# Patient Record
Sex: Male | Born: 1960 | Race: White | Hispanic: No | Marital: Married | Smoking: Current every day smoker
Health system: Southern US, Community
[De-identification: ages and names within clinical notes are randomized; demographics above are authoritative.]

## PROBLEM LIST (undated history)

## (undated) DIAGNOSIS — F329 Major depressive disorder, single episode, unspecified: Secondary | ICD-10-CM

## (undated) DIAGNOSIS — E785 Hyperlipidemia, unspecified: Secondary | ICD-10-CM

## (undated) DIAGNOSIS — F32A Depression, unspecified: Secondary | ICD-10-CM

## (undated) DIAGNOSIS — T50902A Poisoning by unspecified drugs, medicaments and biological substances, intentional self-harm, initial encounter: Secondary | ICD-10-CM

## (undated) DIAGNOSIS — F1010 Alcohol abuse, uncomplicated: Secondary | ICD-10-CM

## (undated) DIAGNOSIS — F419 Anxiety disorder, unspecified: Secondary | ICD-10-CM

## (undated) DIAGNOSIS — B1920 Unspecified viral hepatitis C without hepatic coma: Secondary | ICD-10-CM

## (undated) DIAGNOSIS — F319 Bipolar disorder, unspecified: Secondary | ICD-10-CM

## (undated) DIAGNOSIS — F209 Schizophrenia, unspecified: Secondary | ICD-10-CM

---

## 2010-07-25 ENCOUNTER — Emergency Department (HOSPITAL_COMMUNITY)
Admission: EM | Admit: 2010-07-25 | Discharge: 2010-07-25 | Disposition: A | Discharge status: Home / Self Care | Payer: Self-pay | Attending: Emergency Medicine | Admitting: Emergency Medicine

## 2010-07-25 DIAGNOSIS — H0019 Chalazion unspecified eye, unspecified eyelid: Secondary | ICD-10-CM | POA: Insufficient documentation

## 2010-07-25 DIAGNOSIS — H5789 Other specified disorders of eye and adnexa: Secondary | ICD-10-CM | POA: Insufficient documentation

## 2010-10-11 ENCOUNTER — Emergency Department (HOSPITAL_COMMUNITY)
Admission: EM | Admit: 2010-10-11 | Discharge: 2010-10-11 | Disposition: A | Discharge status: Home / Self Care | Payer: Self-pay | Attending: Emergency Medicine | Admitting: Emergency Medicine

## 2010-10-11 DIAGNOSIS — Y92009 Unspecified place in unspecified non-institutional (private) residence as the place of occurrence of the external cause: Secondary | ICD-10-CM | POA: Insufficient documentation

## 2010-10-11 DIAGNOSIS — S61409A Unspecified open wound of unspecified hand, initial encounter: Secondary | ICD-10-CM | POA: Insufficient documentation

## 2010-10-11 DIAGNOSIS — IMO0002 Reserved for concepts with insufficient information to code with codable children: Secondary | ICD-10-CM | POA: Insufficient documentation

## 2010-10-11 DIAGNOSIS — IMO0001 Reserved for inherently not codable concepts without codable children: Secondary | ICD-10-CM | POA: Insufficient documentation

## 2010-11-21 ENCOUNTER — Emergency Department (HOSPITAL_COMMUNITY)
Admission: EM | Admit: 2010-11-21 | Discharge: 2010-11-22 | Disposition: A | Discharge status: Home / Self Care | Payer: Self-pay | Attending: Emergency Medicine | Admitting: Emergency Medicine

## 2010-11-21 DIAGNOSIS — F1020 Alcohol dependence, uncomplicated: Secondary | ICD-10-CM | POA: Insufficient documentation

## 2010-11-21 DIAGNOSIS — F1010 Alcohol abuse, uncomplicated: Secondary | ICD-10-CM | POA: Insufficient documentation

## 2010-11-21 LAB — ETHANOL: Alcohol, Ethyl (B): 135 mg/dL — ABNORMAL HIGH (ref 0–11)

## 2010-11-21 LAB — CBC
HCT: 45.5 % (ref 39.0–52.0)
MCHC: 35.4 g/dL (ref 30.0–36.0)
Platelets: 418 10*3/uL — ABNORMAL HIGH (ref 150–400)
RDW: 13.2 % (ref 11.5–15.5)
WBC: 8 10*3/uL (ref 4.0–10.5)

## 2010-11-21 LAB — DIFFERENTIAL
Basophils Absolute: 0.1 10*3/uL (ref 0.0–0.1)
Eosinophils Absolute: 0.2 10*3/uL (ref 0.0–0.7)
Eosinophils Relative: 3 % (ref 0–5)
Monocytes Absolute: 0.5 10*3/uL (ref 0.1–1.0)

## 2010-11-21 LAB — COMPREHENSIVE METABOLIC PANEL
AST: 21 U/L (ref 0–37)
Albumin: 4.2 g/dL (ref 3.5–5.2)
BUN: 9 mg/dL (ref 6–23)
Chloride: 105 mEq/L (ref 96–112)
Creatinine, Ser: 1.09 mg/dL (ref 0.50–1.35)
Potassium: 4.1 mEq/L (ref 3.5–5.1)
Total Bilirubin: 0.3 mg/dL (ref 0.3–1.2)
Total Protein: 8.1 g/dL (ref 6.0–8.3)

## 2011-05-21 ENCOUNTER — Emergency Department (HOSPITAL_COMMUNITY): Payer: Self-pay

## 2011-05-21 ENCOUNTER — Emergency Department (HOSPITAL_COMMUNITY)
Admission: EM | Admit: 2011-05-21 | Discharge: 2011-05-21 | Disposition: A | Discharge status: Home / Self Care | Payer: Self-pay | Attending: Emergency Medicine | Admitting: Emergency Medicine

## 2011-05-21 DIAGNOSIS — R10819 Abdominal tenderness, unspecified site: Secondary | ICD-10-CM | POA: Insufficient documentation

## 2011-05-21 DIAGNOSIS — R079 Chest pain, unspecified: Secondary | ICD-10-CM | POA: Insufficient documentation

## 2011-05-21 DIAGNOSIS — F17200 Nicotine dependence, unspecified, uncomplicated: Secondary | ICD-10-CM | POA: Insufficient documentation

## 2011-05-21 DIAGNOSIS — R109 Unspecified abdominal pain: Secondary | ICD-10-CM | POA: Insufficient documentation

## 2011-05-21 HISTORY — DX: Depression, unspecified: F32.A

## 2011-05-21 HISTORY — DX: Major depressive disorder, single episode, unspecified: F32.9

## 2011-05-21 HISTORY — DX: Unspecified viral hepatitis C without hepatic coma: B19.20

## 2011-05-21 LAB — URINALYSIS, ROUTINE W REFLEX MICROSCOPIC
Bilirubin Urine: NEGATIVE
Hgb urine dipstick: NEGATIVE
Ketones, ur: NEGATIVE mg/dL
Nitrite: NEGATIVE
Protein, ur: NEGATIVE mg/dL
Specific Gravity, Urine: 1.007 (ref 1.005–1.030)
Urobilinogen, UA: 0.2 mg/dL (ref 0.0–1.0)

## 2011-05-21 LAB — COMPREHENSIVE METABOLIC PANEL
Alkaline Phosphatase: 95 U/L (ref 39–117)
BUN: 8 mg/dL (ref 6–23)
CO2: 25 mEq/L (ref 19–32)
Chloride: 105 mEq/L (ref 96–112)
Creatinine, Ser: 0.84 mg/dL (ref 0.50–1.35)
GFR calc Af Amer: >90 mL/min (ref >90)
GFR calc non Af Amer: >90 mL/min (ref >90)
Glucose, Bld: 107 mg/dL — ABNORMAL HIGH (ref 70–99)
Potassium: 3.8 mEq/L (ref 3.5–5.1)
Total Bilirubin: 0.2 mg/dL — ABNORMAL LOW (ref 0.3–1.2)

## 2011-05-21 LAB — TROPONIN I
Troponin I: <0.3 ng/mL (ref <0.30)
Troponin I: <0.3 ng/mL (ref <0.30)

## 2011-05-21 LAB — LIPASE, BLOOD: Lipase: 87 U/L — ABNORMAL HIGH (ref 11–59)

## 2011-05-21 LAB — CBC
HCT: 42.8 % (ref 39.0–52.0)
Hemoglobin: 14.7 g/dL (ref 13.0–17.0)
MCV: 90.7 fL (ref 78.0–100.0)
WBC: 10.3 10*3/uL (ref 4.0–10.5)

## 2011-05-21 NOTE — ED Provider Notes (Signed)
History     CSN: [id number]  Arrival date & time 05/21/11  0754   First MD Initiated Contact with Patient 05/21/11 0805      Chief Complaint  Patient presents with  . Chest Pain    (Consider location/radiation/quality/duration/timing/severity/associated sxs/prior treatment) Patient is a 51 y.o. male presenting with chest pain. The history is provided by the patient. No language interpreter was used.  Chest Pain The chest pain began more  than 1 month ago (~6 months). Episode Length: An hour or two at a time. Chest pain occurs intermittently. The chest pain is unchanged. Associated with: Unknown associated factors, not related to exertion. The pain is currently at 0/10. The severity of the pain is moderate. Quality: Occasionally sharp, occasionally burning. Unable to qualify most episodes. The pain does not radiate. Primary symptoms include abdominal pain (mild, occasional). Pertinent negatives for primary symptoms include no fever, no shortness of breath, no cough and no vomiting. He tried nothing for the symptoms. Risk factors include male gender.     Past Medical History  Diagnosis Date  . Hepatitis C     History reviewed. No pertinent past surgical history.  History reviewed. No pertinent family history.  History  Substance Use Topics  . Smoking status: Current Everyday Smoker  . Smokeless tobacco: Not on file  . Alcohol Use: No     former alcohol use      Review of Systems  Constitutional: Negative for fever and chills.  Respiratory: Negative for cough and shortness of breath.   Cardiovascular: Positive for chest pain.  Gastrointestinal: Positive for abdominal pain (mild, occasional). Negative for vomiting, diarrhea and constipation.  All other systems reviewed and are negative.    Allergies  Review of patient's allergies indicates no known allergies.  Home Medications  No current outpatient prescriptions on file.  BP 118/80  Pulse 74  Temp(Src) 98 F (36.7  C) (Oral)  Resp 18  Ht 5\' 11"  (1.803 m)  Wt 180 lb (81.647 kg)  BMI 25.10 kg/m2  SpO2 100%  Physical Exam  Constitutional: He is oriented to person, place, and time. He appears well-developed and well-nourished. No distress.  HENT:  Head: Normocephalic and atraumatic.  Mouth/Throat: No oropharyngeal exudate.  Eyes: EOM are normal. Pupils are equal, round, and reactive to light.  Neck: Normal range of motion. Neck supple.  Cardiovascular: Normal rate and regular rhythm.  Exam reveals no friction rub.   No murmur heard. Pulmonary/Chest: Effort normal and breath sounds normal. No respiratory distress. He has no wheezes. He has no rales.  Abdominal: He exhibits no distension. There is tenderness (mild, epigastric, suprapubic). There is no rebound.  Musculoskeletal: Normal range of motion. He exhibits no edema.  Neurological: He is alert and oriented to person, place, and time.  Skin: He is not diaphoretic.    ED Course  Procedures (including critical care time)  Labs Reviewed - No data to display No results found.   1. Chest pain      Date: 05/21/2011  Rate: 49  Rhythm: normal sinus rhythm  QRS Axis: normal  Intervals: normal  ST/T Wave abnormalities: normal  Conduction Disutrbances:none  Narrative Interpretation:   Old EKG Reviewed: none available    MDM  78 M presents with intermittent chest pain the past 6 months.  No instigating factors to his pain. No known associated factors. No alleviating or exacerbating factors. Not having pain daily. No history of cardiac disease. Does have history of hepatitis C and depression.  Patient is afebrile and vital signs are stable. Exam is benign. We'll check delta troponin and other basic labs including liver function tests. Chest x-ray ordered. EKG nonischemic labs are normal. Patient's 3 serial troponins are negative. Stable for discharge. Patient given resources to find a primary care doctor.   Elwin Mocha, MD 05/21/11 [phone]

## 2011-05-21 NOTE — ED Notes (Signed)
Pt undressed and in gown. Cardiac monitor, pulse ox, and bp cuff on. 

## 2011-05-21 NOTE — ED Provider Notes (Signed)
  I performed a history and physical examination of Alfred Jackson and discussed his management with Dr. Gwendolyn Grant.  I agree with the history, physical, assessment, and plan of care, with the following exceptions: None  Chest pain on and off for the past 6 months. It lasts approximately an hour. The pain remains localized to left side. Occasionally burning.  Regular rate and rhythm. No reproducible chest pain. Lungs are clear to auscultation bilaterally. Abdomen soft, nontender, nondistended.  I was present for the following procedures: None Time Spent in Critical Care of the patient: None Time spent in discussions with the patient and family: 10 min  Tildon Husky, MD 05/21/11 1250

## 2011-05-21 NOTE — ED Notes (Signed)
Pt. Reports having intermittent chest pain for over 6 months with sob and nausea also intermittent. Pain is burning and non-radiating,  Pt. Also reports. "My lungs hurt for over 1 year". Pt.  Is in NAD

## 2011-07-04 ENCOUNTER — Inpatient Hospital Stay (HOSPITAL_COMMUNITY): Admission: AD | Admit: 2011-07-04 | Payer: PRIVATE HEALTH INSURANCE | Source: Ambulatory Visit | Admitting: Psychiatry

## 2011-07-04 ENCOUNTER — Emergency Department (HOSPITAL_COMMUNITY)
Admission: EM | Admit: 2011-07-04 | Discharge: 2011-07-06 | Disposition: A | Discharge status: Home / Self Care | Payer: Self-pay | Attending: Emergency Medicine | Admitting: Emergency Medicine

## 2011-07-04 ENCOUNTER — Encounter (HOSPITAL_COMMUNITY): Payer: Self-pay

## 2011-07-04 DIAGNOSIS — R5381 Other malaise: Secondary | ICD-10-CM | POA: Insufficient documentation

## 2011-07-04 DIAGNOSIS — I10 Essential (primary) hypertension: Secondary | ICD-10-CM | POA: Insufficient documentation

## 2011-07-04 DIAGNOSIS — R45851 Suicidal ideations: Secondary | ICD-10-CM

## 2011-07-04 DIAGNOSIS — F3289 Other specified depressive episodes: Secondary | ICD-10-CM | POA: Insufficient documentation

## 2011-07-04 DIAGNOSIS — T50902A Poisoning by unspecified drugs, medicaments and biological substances, intentional self-harm, initial encounter: Secondary | ICD-10-CM | POA: Insufficient documentation

## 2011-07-04 DIAGNOSIS — R404 Transient alteration of awareness: Secondary | ICD-10-CM | POA: Insufficient documentation

## 2011-07-04 DIAGNOSIS — F329 Major depressive disorder, single episode, unspecified: Secondary | ICD-10-CM | POA: Insufficient documentation

## 2011-07-04 DIAGNOSIS — T50901A Poisoning by unspecified drugs, medicaments and biological substances, accidental (unintentional), initial encounter: Secondary | ICD-10-CM | POA: Insufficient documentation

## 2011-07-04 DIAGNOSIS — Z8619 Personal history of other infectious and parasitic diseases: Secondary | ICD-10-CM | POA: Insufficient documentation

## 2011-07-04 DIAGNOSIS — R5383 Other fatigue: Secondary | ICD-10-CM | POA: Insufficient documentation

## 2011-07-04 LAB — BASIC METABOLIC PANEL
BUN: 10 mg/dL (ref 6–23)
CO2: 23 mEq/L (ref 19–32)
Calcium: 9 mg/dL (ref 8.4–10.5)
Chloride: 101 mEq/L (ref 96–112)
Creatinine, Ser: 1.02 mg/dL (ref 0.50–1.35)

## 2011-07-04 LAB — URINALYSIS, ROUTINE W REFLEX MICROSCOPIC
Bilirubin Urine: NEGATIVE
Glucose, UA: NEGATIVE mg/dL
Ketones, ur: NEGATIVE mg/dL
pH: 5.5 (ref 5.0–8.0)

## 2011-07-04 LAB — RAPID URINE DRUG SCREEN, HOSP PERFORMED
Amphetamines: NOT DETECTED
Barbiturates: NOT DETECTED
Benzodiazepines: POSITIVE — AB
Cocaine: NOT DETECTED
Tetrahydrocannabinol: POSITIVE — AB

## 2011-07-04 LAB — CBC
HCT: 44.6 % (ref 39.0–52.0)
Hemoglobin: 15.5 g/dL (ref 13.0–17.0)
MCHC: 34.8 g/dL (ref 30.0–36.0)
MCV: 88.5 fL (ref 78.0–100.0)

## 2011-07-04 LAB — DIFFERENTIAL
Basophils Relative: 0 % (ref 0–1)
Eosinophils Relative: 1 % (ref 0–5)
Monocytes Absolute: 0.7 10*3/uL (ref 0.1–1.0)
Monocytes Relative: 6 % (ref 3–12)
Neutro Abs: 8.6 10*3/uL — ABNORMAL HIGH (ref 1.7–7.7)

## 2011-07-04 MED ORDER — LORAZEPAM 1 MG PO TABS: 1.0000 mg | ORAL_TABLET | Freq: Three times a day (TID) | ORAL | Status: DC | PRN

## 2011-07-04 MED ORDER — ACETAMINOPHEN 325 MG PO TABS: 650.0000 mg | ORAL_TABLET | ORAL | Status: DC | PRN

## 2011-07-04 MED ORDER — ONDANSETRON HCL 4 MG PO TABS: 4.0000 mg | ORAL_TABLET | Freq: Three times a day (TID) | ORAL | Status: DC | PRN

## 2011-07-04 MED ORDER — SODIUM CHLORIDE 0.9 % IV BOLUS (SEPSIS)
1000.0000 mL | Freq: Once | INTRAVENOUS | Status: AC
  Administered 2011-07-04: 1000 mL via INTRAVENOUS

## 2011-07-04 MED ORDER — IBUPROFEN 600 MG PO TABS
600.0000 mg | ORAL_TABLET | Freq: Three times a day (TID) | ORAL | Status: DC | PRN
  Administered 2011-07-05: 600 mg via ORAL
  Filled 2011-07-04: qty 1

## 2011-07-04 NOTE — ED Notes (Signed)
MWU:XLKG<MW> Expected date:07/04/11<BR> Expected time: 2:29 AM<BR> Means of arrival:Ambulance<BR> Comments:<BR> Overdose, intentional

## 2011-07-04 NOTE — ED Notes (Signed)
Report to [facility], transferred to RM 38

## 2011-07-04 NOTE — ED Notes (Signed)
ACT team in room with pt 

## 2011-07-04 NOTE — ED Notes (Signed)
Diet tray given

## 2011-07-04 NOTE — ED Notes (Signed)
Pharmacy tech in talking to pt.

## 2011-07-04 NOTE — ED Notes (Signed)
Pt up to BR

## 2011-07-04 NOTE — ED Provider Notes (Signed)
History     CSN: [id number]  Arrival date & time 07/04/11  0240   First MD Initiated Contact with Patient 07/04/11 [phone]      Chief Complaint  Patient presents with  . Drug Overdose    (Consider location/radiation/quality/duration/timing/severity/associated sxs/prior treatment) HPI Comments: Patient presents after an ingestion of gabapentin, Vistaril, Zoloft. Ingestion was performed in a suicide attempt. Was found with a suicide note. His wife called EMS upon her arrival home. Found him with altered mental status. On arrival the patient is breathing comfortably and in no acute distress  Patient is a 51 y.o. male presenting with Overdose.  Drug Overdose This is a new problem. The current episode started 6 to 12 hours ago. The problem occurs constantly. The problem has been gradually worsening. Pertinent negatives include no chest pain, no abdominal pain, no headaches and no shortness of breath. The symptoms are aggravated by nothing. The symptoms are relieved by nothing. He has tried nothing for the symptoms.    Past Medical History  Diagnosis Date  . Hepatitis C   . Depression     History reviewed. No pertinent past surgical history.  History reviewed. No pertinent family history.  History  Substance Use Topics  . Smoking status: Current Everyday Smoker -- 0.5 packs/day  . Smokeless tobacco: Not on file  . Alcohol Use: No     former alcohol use      Review of Systems  Constitutional: Positive for fatigue. Negative for fever, activity change and appetite change.  HENT: Negative for congestion, sore throat, rhinorrhea, neck pain and neck stiffness.   Respiratory: Negative for cough and shortness of breath.   Cardiovascular: Negative for chest pain and palpitations.  Gastrointestinal: Positive for nausea. Negative for vomiting and abdominal pain.  Genitourinary: Negative for dysuria, urgency, frequency and flank pain.  Neurological: Negative for dizziness, weakness,  light-headedness, numbness and headaches.  Psychiatric/Behavioral: Positive for suicidal ideas and self-injury. Negative for hallucinations.  All other systems reviewed and are negative.    Allergies  Review of patient's allergies indicates no known allergies.  Home Medications   Current Outpatient Rx  Name Route Sig Dispense Refill  . GABAPENTIN 300 MG PO CAPS Oral Take 300 mg by mouth 3 (three) times daily.      Marland Kitchen HYDROXYZINE PAMOATE 25 MG PO CAPS Oral Take 25 mg by mouth 3 (three) times daily as needed.    . SERTRALINE HCL 100 MG PO TABS Oral Take 150 mg by mouth daily.        BP 114/74  Pulse 75  Temp(Src) 98.7 F (37.1 C) (Oral)  Resp 15  SpO2 96%  Physical Exam  Nursing note and vitals reviewed. Constitutional: He is oriented to person, place, and time. He appears well-developed and well-nourished. No distress.       Drowsy on examination  HENT:  Head: Normocephalic and atraumatic.  Mouth/Throat: Oropharynx is clear and moist.  Eyes: Conjunctivae and EOM are normal. Pupils are equal, round, and reactive to light.  Neck: Normal range of motion. Neck supple.  Cardiovascular: Normal rate, regular rhythm, normal heart sounds and intact distal pulses.  Exam reveals no gallop and no friction rub.   No murmur heard. Pulmonary/Chest: Effort normal and breath sounds normal.  Abdominal: Soft. Bowel sounds are normal. There is no tenderness. There is no rebound and no guarding.  Musculoskeletal: Normal range of motion. He exhibits no tenderness.  Neurological: He is alert and oriented to person, place, and time. No cranial  nerve deficit.       Slight slurring of speech from ingestion  Skin: Skin is warm and dry. No rash noted.  Psychiatric: His speech is delayed and slurred. He exhibits a depressed mood. He expresses suicidal ideation. He expresses no homicidal ideation. He expresses suicidal plans. He expresses no homicidal plans.    ED Course  Procedures (including critical  care time)   Date: 07/04/2011  Rate: 68  Rhythm: normal sinus rhythm  QRS Axis: normal  Intervals: normal  ST/T Wave abnormalities: normal  Conduction Disutrbances:none  Narrative Interpretation:   Old EKG Reviewed: unchanged  Labs Reviewed  CBC - Abnormal; Notable for the following:    WBC 12.0 (*)    Platelets 417 (*)    All other components within normal limits  DIFFERENTIAL - Abnormal; Notable for the following:    Neutro Abs 8.6 (*)    All other components within normal limits  BASIC METABOLIC PANEL - Abnormal; Notable for the following:    Sodium 134 (*)    Glucose, Bld 113 (*)    GFR calc non Af Amer 85 (*)    All other components within normal limits  SALICYLATE LEVEL - Abnormal; Notable for the following:    Salicylate Lvl <2.0 (*)    All other components within normal limits  ETHANOL  ACETAMINOPHEN LEVEL  URINALYSIS, ROUTINE W REFLEX MICROSCOPIC  URINE RAPID DRUG SCREEN (HOSP PERFORMED)   No results found.   1. Overdose drug   2. Depression   3. Suicidal ideations       MDM  Substances in which he ingested or not life-threatening and the amounts he ingested. He is out of the charcoal window. Medical screening labs were obtained and are relatively unremarkable. This time he is medically stable. He'll be evaluated by psychiatry. Psychiatric holding orders were performed. IVC paperwork was completed. Patient is a threat to himself. Will require admission for psychiatric treatment         Dayton Bailiff, MD 07/04/11 [phone]

## 2011-07-04 NOTE — BH Assessment (Signed)
Assessment Note   Alfred Jackson is an 51 y.o. male who was brought to [facility] ED by EMS after reportedly attempting suicide by overdoes on Zoloft, Gabapenitine, and Visteril. Pt was reportedly discovered, by his girlfriend, with a suicide note. Patient neither confirmed nor denied SI. Pt states he was in an argument with his girlfriend earlier tonight. Pt also endorses auditory and visual hallucinations, stating he sees and hears people. He states hallucinations have been going on "a while." Pt reports history of depression, anxiety, and alcohol abuse. Patient reports no current psychiatrist or therapist. Pt denies any prior suicide attempt. Pt denies any HI.   Pt appeared drowsy and spoke with slurred speech. Pt is a poor historian and was vague in descriptions of details of precipitating event. When asked about SI, pt looked at this Clinical research associate and smiled. Pt was then asked if he knew how much medication he took, pt continued to smile and nod, but would not give details. When asked if overdoes was intentional, pt continued to simile but would not answer. This Clinical research associate attempted to gain collateral from girlfriend, Alfred Jackson, but girlfriend's phone has been disconnected. Pt will not give additional phone numbers at this time.      Axis I: Major Depression, single episode Axis II: Deferred Axis III:  Past Medical History  Diagnosis Date  . Hepatitis C   . Depression    Axis IV: other psychosocial or environmental problems Axis V: 11-20 some danger of hurting self or others possible OR occasionally fails to maintain minimal personal hygiene OR gross impairment in communication  Past Medical History:  Past Medical History  Diagnosis Date  . Hepatitis C   . Depression     History reviewed. No pertinent past surgical history.  Family History: History reviewed. No pertinent family history.  Social History:  reports that he has been smoking.  He does not have any smokeless tobacco history on file. He  reports that he does not drink alcohol or use illicit drugs.  Additional Social History:  Alcohol / Drug Use History of alcohol / drug use?: Yes Substance #1 Name of Substance 1: Alcohol 1 - Last Use / Amount: 8/12 Allergies: No Known Allergies  Home Medications:  Medications Prior to Admission  Medication Dose Route Frequency Provider Last Rate Last Dose  . acetaminophen (TYLENOL) tablet 650 mg  650 mg Oral Q4H PRN Dayton Bailiff, MD      . ibuprofen (ADVIL,MOTRIN) tablet 600 mg  600 mg Oral Q8H PRN Dayton Bailiff, MD      . LORazepam (ATIVAN) tablet 1 mg  1 mg Oral Q8H PRN Dayton Bailiff, MD      . ondansetron [facility]) tablet 4 mg  4 mg Oral Q8H PRN Dayton Bailiff, MD      . sodium chloride 0.9 % bolus 1,000 mL  1,000 mL Intravenous Once Dayton Bailiff, MD   1,000 mL at 07/04/11 0334   Medications Prior to Admission  Medication Sig Dispense Refill  . gabapentin (NEURONTIN) 300 MG capsule Take 300 mg by mouth 3 (three) times daily.        . sertraline (ZOLOFT) 100 MG tablet Take 150 mg by mouth daily.          OB/GYN Status:  No LMP for male patient.  General Assessment Data Location of Assessment: [facility] ED ACT Assessment: Yes Living Arrangements: Spouse/significant other Can pt return to current living arrangement?: Yes Admission Status: Involuntary Is patient capable of signing voluntary admission?: Yes Transfer from:  Acute Hospital Referral Source: MD  Education Status Is patient currently in school?: No  Risk to self Suicidal Ideation: Yes-Currently Present Suicidal Intent: Yes-Currently Present Is patient at risk for suicide?: Yes Suicidal Plan?: Yes-Currently Present Specify Current Suicidal Plan: attempted suicide by overdoes Access to Means: Yes Specify Access to Suicidal Means: medication What has been your use of drugs/alcohol within the last 12 months?: alcohol Previous Attempts/Gestures: No How many times?: 0  Other Self Harm Risks: none Triggers for Past Attempts: None  known Intentional Self Injurious Behavior: None Family Suicide History: Unknown Recent stressful life event(s): Conflict (Comment) (fight with girlfreind) Persecutory voices/beliefs?: No Depression: Yes Depression Symptoms: Despondent Substance abuse history and/or treatment for substance abuse?: No Suicide prevention information given to non-admitted patients: Not applicable  Risk to Others Homicidal Ideation: No Thoughts of Harm to Others: No Current Homicidal Intent: No Current Homicidal Plan: No Access to Homicidal Means: No Identified Victim: none History of harm to others?: No Assessment of Violence: None Noted Violent Behavior Description: none Does patient have access to weapons?: No Criminal Charges Pending?: No Does patient have a court date: No  Psychosis Hallucinations: Auditory Delusions: None noted  Mental Status Report Appear/Hygiene: Bizarre Eye Contact: Fair Motor Activity: Unremarkable Speech: Slurred Level of Consciousness: Drowsy Mood: Depressed Affect: Depressed Anxiety Level: None Thought Processes: Relevant Judgement: Impaired Orientation: Person;Place Obsessive Compulsive Thoughts/Behaviors: None  Cognitive Functioning Concentration: Normal Memory: Remote Intact;Recent Intact IQ: Average Insight: Poor Impulse Control: Poor Appetite: Fair Weight Loss: 0  Weight Gain: 0  Sleep: Decreased Vegetative Symptoms: None  Prior Inpatient Therapy Prior Inpatient Therapy: No  Prior Outpatient Therapy Prior Outpatient Therapy: No  ADL Screening (condition at time of admission) Patient's cognitive ability adequate to safely complete daily activities?: Yes Patient able to express need for assistance with ADLs?: Yes Independently performs ADLs?: Yes Weakness of Legs: None Weakness of Arms/Hands: None  Home Assistive Devices/Equipment Home Assistive Devices/Equipment: None    Abuse/Neglect Assessment (Assessment to be complete while  patient is alone) Physical Abuse: Denies Verbal Abuse: Denies Sexual Abuse: Denies Exploitation of patient/patient's resources: Denies Self-Neglect: Denies Values / Beliefs Cultural Requests During Hospitalization: None   Advance Directives (For Healthcare) Advance Directive: Patient does not have advance directive Nutrition Screen Diet: Regular Unintentional weight loss greater than 10lbs within the last month: No Dysphagia: No Home Tube Feeding or Total Parenteral Nutrition (TPN): No Patient appears severely malnourished: No Pregnant or Lactating: No  Additional Information 1:1 In Past 12 Months?: No CIRT Risk: No Elopement Risk: No Does patient have medical clearance?: Yes     Disposition:  Disposition Disposition of Patient: Inpatient treatment program Type of inpatient treatment program: Adult Patient has been referred to [facility] for adult inpatient psychiatric treatment. On Site Evaluation by:   Reviewed with Physician:     Marjean Donna 07/04/2011 5:34 AM

## 2011-07-04 NOTE — ED Notes (Signed)
MD at bedside. 

## 2011-07-04 NOTE — ED Notes (Signed)
"  Alfred Jackson"- pt's family called x2 requesting information regarding pt she was advised that pt was here, resting, and that in the am at 9 he could make a phone call and call her if he wishes to do so but at this time I could not give her any further information on pt. pt has not signed consent to give out any information.

## 2011-07-04 NOTE — ED Notes (Signed)
Pt. Was given a urinal 

## 2011-07-04 NOTE — ED Notes (Signed)
Pt intentionally took gabapentin and visteril and left a suicidal note, ems was called by wife when she got home from work. They quit drinking together 6 moths ago and hes been unstable since then

## 2011-07-04 NOTE — ED Notes (Signed)
IVC paperwork dropped of by sherriff  Placed in chart

## 2011-07-05 NOTE — ED Notes (Signed)
Pt accepted to [facility] to [place]. Bed 400-2. Pt is IVC. Confirmed in compliance, however [employer] filled out Findings & Custody incorrectly (will have officers to pick up complete correctly). Updated RN & EDP. Completed documentation and forms for transport.

## 2011-07-05 NOTE — ED Notes (Signed)
Pt alert and oriented x4. Respirations even and unlabored, bilateral symmetrical rise and fall of chest. Skin warm and dry. In no acute distress. Denies needs.   

## 2011-07-05 NOTE — ED Notes (Signed)
Pt alert and oriented x4. Respirations even and unlabored, bilateral symmetrical rise and fall of chest. Skin warm and dry. In no acute distress. Reports headache 6/10. Will look at [facility]. At present pt denies SI/HI/ or auditory or visual hallucinations.

## 2011-07-05 NOTE — ED Notes (Signed)
Report given to Woodroe Mode at [facility]

## 2011-07-05 NOTE — ED Notes (Signed)
Pt asking when he was leaving to go home pt was made aware that he was awaiting in-pt bed for treatment, pt also made aware that he had several phone calls after 9pm and that at 9am he could make phone calls to let his family/friends know what was going on with him.

## 2011-07-05 NOTE — ED Notes (Signed)
[facility] called back and reported that they are not going to be able to take the pt today.

## 2011-07-06 NOTE — ED Notes (Signed)
Pt d/c home per MD order. One bag of pt belongings returned to pt. D/c instructions given.

## 2011-07-06 NOTE — ED Provider Notes (Cosign Needed)
24:41 AM 52 year old man attempted suicide by ingesiotion of gabapentin, Vistaril, and Zoloft.  Involuntarily committed.  Awaiting a bed at [facility]. Osvaldo Human, M.D.   1:19 PM Spoke to pt.  He takes Zoloft for depression, prescribed by [employer].  He took an overdose of Zoloft, gabapentin and Vistaril, and left a suicide note 2 days ago.  He has been taken off medicines and has been observed without problems.  He was supposed to go to [facility] but did not get a bed there.  He feels better and wants to go home.  I advised him that we would have him have telepsych consultation to see if he can be released home. Osvaldo Human, M.D.   4:06 PM Pt had telepsych consult with Dr. Henderson Cloud, who feels that pt can be released from his involuntary commitment.  He should followup at [facility].  No meds were prescribed. Osvaldo Human, M.D.   Carleene Cooper III, MD 07/06/11 [phone]

## 2011-07-06 NOTE — [facility] Counselor (Deleted)
Re-assessed pt to determine his level of depression. Pt stated that he "feel fine" and "I'm ready to go home, that was stupid of me to do". When asked if he had ever done anything like that before with pills, he said, "yeah, about 6-8 months ago, but I ain't never gonna do nothing like that again". Pt believes that he can safely return home and denied having any S/I, H/I, AH, or depression at this time. Pt said that if he could not go home today, it would be "ok". Pt presents with some depression and anxiety and would benefit from continued care.
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
## 2011-07-06 NOTE — ED Notes (Signed)
Per ACT rep telepsych is ordered by the MD to see if pt can safely d/c home.

## 2011-07-06 NOTE — Discharge Instructions (Signed)
Alfred Jackson, YOU WERE HOSPITALIZED IN THE [facility] FOR TWO DAYS AFTER YOU TOOK A DRUG OVERDOSE.  NOW YOU ARE FEELING BETTER.  YOU HAD A TELEPSYCH CONSULT WITH DR. Henderson Cloud, WHO RELEASED YOU FROM INVOLUNTARY COMMITMENT.  IT IS SAFE TO GO HOME.  YOU WILL NEED TO HAVE FOLLOWUP AT [facility].  CALL [facility] ON Monday TO MAKE A FOLLOWUP APPOINTMENT.      Depression You have signs of depression. This is a common problem. It can occur at any age. It is often hard to recognize. People can suffer from depression and still have moments of enjoyment. Depression interferes with your basic ability to function in life. It upsets your relationships, sleep, eating, and work habits. CAUSES  Depression is believed to be caused by an imbalance in brain chemicals. It may be triggered by an unpleasant event. Relationship crises, a death in the family, financial worries, retirement, or other stressors are normal causes of depression. Depression may also start for no known reason. Other factors that may play a part include medical illnesses, some medicines, genetics, and alcohol or drug abuse. SYMPTOMS   Feeling unhappy or worthless.   Long-lasting (chronic) tiredness or worn-out feeling.   Self-destructive thoughts and actions.   Not being able to sleep or sleeping too much.   Eating more than usual or not eating at all.   Headaches or feeling anxious.   Trouble concentrating or making decisions.   Unexplained physical problems and substance abuse.  TREATMENT  Depression usually gets better with treatment. This can include:  Antidepressant medicines. It can take weeks before the proper dose is achieved and benefits are reached.   Talking with a therapist, clergyperson, counselor, or friend. These people can help you gain insight into your problem and regain control of your life.   Eating a good diet.   Getting regular physical exercise, such as walking  for 30 minutes every day.   Not abusing alcohol or drugs.  Treating depression often takes 6 months or longer. This length of treatment is needed to keep symptoms from returning. Call your caregiver and arrange for follow-up care as suggested. SEEK IMMEDIATE MEDICAL CARE IF:   You start to have thoughts of hurting yourself or others.   Call your local emergency services (911 in [place]   Go to your local medical emergency department.   Call the National Suicide Prevention Lifeline: 1-800-273-TALK 507 229 2369).  Document Released: 04/29/2005 Document Revised: 01/09/2011 Document Reviewed: 09/29/2009 [facility] Patient Information 2012 [place], [place].

## 2012-01-29 ENCOUNTER — Emergency Department: Payer: Self-pay | Admitting: Emergency Medicine

## 2012-02-24 ENCOUNTER — Emergency Department: Payer: Self-pay | Admitting: Unknown Physician Specialty

## 2012-03-16 ENCOUNTER — Emergency Department: Payer: Self-pay | Admitting: Emergency Medicine

## 2012-03-16 LAB — COMPREHENSIVE METABOLIC PANEL
Albumin: 3.8 g/dL (ref 3.4–5.0)
Anion Gap: 9 (ref 7–16)
BUN: 8 mg/dL (ref 7–18)
Bilirubin,Total: 0.3 mg/dL (ref 0.2–1.0)
Creatinine: 1 mg/dL (ref 0.60–1.30)
Potassium: 3.9 mmol/L (ref 3.5–5.1)
SGOT(AST): 22 U/L (ref 15–37)
SGPT (ALT): 24 U/L (ref 12–78)
Total Protein: 8.3 g/dL — ABNORMAL HIGH (ref 6.4–8.2)

## 2012-03-16 LAB — CBC
HCT: 46.8 % (ref 40.0–52.0)
MCH: 31.4 pg (ref 26.0–34.0)
MCHC: 33.4 g/dL (ref 32.0–36.0)
MCV: 94 fL (ref 80–100)
Platelet: 414 10*3/uL (ref 150–440)
RBC: 4.98 10*6/uL (ref 4.40–5.90)
RDW: 14.2 % (ref 11.5–14.5)

## 2012-03-16 LAB — TSH: Thyroid Stimulating Horm: 1.61 u[IU]/mL

## 2012-03-16 LAB — ETHANOL
Ethanol %: 0.228 % — ABNORMAL HIGH (ref 0.000–0.080)
Ethanol: 228 mg/dL

## 2012-03-16 LAB — SALICYLATE LEVEL: Salicylates, Serum: 3.8 mg/dL — ABNORMAL HIGH

## 2012-03-16 LAB — ACETAMINOPHEN LEVEL: Acetaminophen: <2 ug/mL

## 2012-03-17 LAB — DRUG SCREEN, URINE
Cannabinoid 50 Ng, Ur ~~LOC~~: POSITIVE (ref ?–50)
Cocaine Metabolite,Ur ~~LOC~~: NEGATIVE (ref ?–300)
MDMA (Ecstasy)Ur Screen: NEGATIVE (ref ?–500)
Phencyclidine (PCP) Ur S: NEGATIVE (ref ?–25)

## 2012-06-21 ENCOUNTER — Encounter (HOSPITAL_COMMUNITY): Payer: Self-pay | Admitting: Emergency Medicine

## 2012-06-21 ENCOUNTER — Emergency Department (HOSPITAL_COMMUNITY)
Admission: EM | Admit: 2012-06-21 | Discharge: 2012-06-21 | Disposition: A | Discharge status: Home / Self Care | Payer: Self-pay | Attending: Emergency Medicine | Admitting: Emergency Medicine

## 2012-06-21 DIAGNOSIS — Z8619 Personal history of other infectious and parasitic diseases: Secondary | ICD-10-CM | POA: Insufficient documentation

## 2012-06-21 DIAGNOSIS — Z8659 Personal history of other mental and behavioral disorders: Secondary | ICD-10-CM | POA: Insufficient documentation

## 2012-06-21 DIAGNOSIS — K089 Disorder of teeth and supporting structures, unspecified: Secondary | ICD-10-CM | POA: Insufficient documentation

## 2012-06-21 DIAGNOSIS — R509 Fever, unspecified: Secondary | ICD-10-CM | POA: Insufficient documentation

## 2012-06-21 DIAGNOSIS — F17200 Nicotine dependence, unspecified, uncomplicated: Secondary | ICD-10-CM | POA: Insufficient documentation

## 2012-06-21 DIAGNOSIS — K0889 Other specified disorders of teeth and supporting structures: Secondary | ICD-10-CM

## 2012-06-21 DIAGNOSIS — R110 Nausea: Secondary | ICD-10-CM | POA: Insufficient documentation

## 2012-06-21 MED ORDER — PENICILLIN V POTASSIUM 500 MG PO TABS: 500.0000 mg | ORAL_TABLET | Freq: Three times a day (TID) | ORAL | Status: DC

## 2012-06-21 MED ORDER — OXYCODONE-ACETAMINOPHEN 5-325 MG PO TABS: 2.0000 | ORAL_TABLET | Freq: Four times a day (QID) | ORAL | Status: DC | PRN

## 2012-06-21 MED ORDER — PENICILLIN V POTASSIUM 250 MG PO TABS
500.0000 mg | ORAL_TABLET | Freq: Once | ORAL | Status: AC
  Administered 2012-06-21: 500 mg via ORAL
  Filled 2012-06-21: qty 2

## 2012-06-21 MED ORDER — OXYCODONE-ACETAMINOPHEN 5-325 MG PO TABS
2.0000 | ORAL_TABLET | Freq: Once | ORAL | Status: AC
  Administered 2012-06-21: 2 via ORAL
  Filled 2012-06-21: qty 2

## 2012-06-21 MED ORDER — OXYCODONE-ACETAMINOPHEN 5-325 MG PO TABS: 2.0000 | ORAL_TABLET | ORAL | Status: DC | PRN

## 2012-06-21 NOTE — ED Provider Notes (Signed)
History    This chart was scribed for non-physician practitioner working with Gwyneth Sprout, MD by Leone Payor, ED Scribe. This patient was seen in room TR09C/TR09C and the patient's care was started at 1813.   CSN: [id number]  Arrival date & time 06/21/12  1813   First MD Initiated Contact with Patient 06/21/12 1958      Chief Complaint  Patient presents with  . Dental Pain     The history is provided by the patient. No language interpreter was used.    Alfred Jackson is a 52 y.o. male who presents to the Emergency Department complaining of new, constant, gradually worsening, gradual onset dental pain starting 3 days ago. Reports the cold air makes the pain worse. He states feeling a bump to the affected area. He has taken tylenol with mild relief. He has associated subjective fever, night sweats, nausea. He denies vomiting, diarrhea, HA.   Pt has h/o hepatitis C.  Pt is a current everyday smoker and occasional alcohol user.  Past Medical History  Diagnosis Date  . Hepatitis C   . Depression     History reviewed. No pertinent past surgical history.  No family history on file.  History  Substance Use Topics  . Smoking status: Current Every Day Smoker -- 0.50 packs/day  . Smokeless tobacco: Not on file  . Alcohol Use: Yes     Comment: former alcohol use      Review of Systems A complete 10 system review of systems was obtained and all systems are negative except as noted in the HPI and PMH.    Allergies  Review of patient's allergies indicates no known allergies.  Home Medications   Current Outpatient Rx  Name  Route  Sig  Dispense  Refill  . acetaminophen (TYLENOL) 500 MG tablet   Oral   Take 1,000 mg by mouth every 6 (six) hours as needed for pain. For pain           BP 115/82  Pulse 96  Temp(Src) 98.5 F (36.9 C) (Oral)  Resp 20  SpO2 96%  Physical Exam  Nursing note and vitals reviewed. Constitutional: He is oriented to person, place, and  time. He appears well-developed and well-nourished. No distress.  HENT:  Head: Normocephalic and atraumatic.  Poor dention. Multiple cracked and decayed teeth. Lower right jaw tenderness to percussion. Right jaw facial swelling and tenderness to palpation.   Eyes: EOM are normal.  Neck: Neck supple. No tracheal deviation present.  Cardiovascular: Normal rate.   Pulmonary/Chest: Effort normal. No respiratory distress.  Musculoskeletal: Normal range of motion.  Neurological: He is alert and oriented to person, place, and time.  Skin: Skin is warm and dry.  Psychiatric: He has a normal mood and affect. His behavior is normal.    ED Course  Procedures (including critical care time)  DIAGNOSTIC STUDIES: Oxygen Saturation is 96% on room air, adequate by my interpretation.    COORDINATION OF CARE:  8:25 PM Discussed treatment plan which includes pain medication and oral antibiotics with pt at bedside and pt agreed to plan. Advised to follow up with dentist.     Labs Reviewed - No data to display No results found.   1. Pain, dental       MDM  9:47 PM Patient afebrile with stable vitals. Patient instructed to follow up with dentist within 48 hours. Patient will be discharged with Pen VK and Percocet. No further evaluation needed at this time.  I personally performed the services described in this documentation, which was scribed in my presence. The recorded information has been reviewed and is accurate.    Emilia Beck, PA-C 06/21/12 2148

## 2012-06-21 NOTE — ED Notes (Signed)
C/o toothache/dental abscess x 2 days.

## 2012-06-21 NOTE — ED Notes (Signed)
Awaiting consult with case management

## 2012-06-22 NOTE — ED Provider Notes (Signed)
Medical screening examination/treatment/procedure(s) were performed by non-physician practitioner and as supervising physician I was immediately available for consultation/collaboration.   Maddison Kilner, MD 06/22/12 0010 

## 2012-08-03 ENCOUNTER — Encounter (HOSPITAL_COMMUNITY): Payer: Self-pay | Admitting: Nurse Practitioner

## 2012-08-03 ENCOUNTER — Emergency Department (HOSPITAL_COMMUNITY)
Admission: EM | Admit: 2012-08-03 | Discharge: 2012-08-03 | Disposition: A | Discharge status: Home / Self Care | Payer: Self-pay | Attending: Emergency Medicine | Admitting: Emergency Medicine

## 2012-08-03 DIAGNOSIS — R229 Localized swelling, mass and lump, unspecified: Secondary | ICD-10-CM | POA: Insufficient documentation

## 2012-08-03 DIAGNOSIS — B1920 Unspecified viral hepatitis C without hepatic coma: Secondary | ICD-10-CM | POA: Insufficient documentation

## 2012-08-03 DIAGNOSIS — F209 Schizophrenia, unspecified: Secondary | ICD-10-CM | POA: Insufficient documentation

## 2012-08-03 DIAGNOSIS — K089 Disorder of teeth and supporting structures, unspecified: Secondary | ICD-10-CM | POA: Insufficient documentation

## 2012-08-03 DIAGNOSIS — F411 Generalized anxiety disorder: Secondary | ICD-10-CM | POA: Insufficient documentation

## 2012-08-03 DIAGNOSIS — F3289 Other specified depressive episodes: Secondary | ICD-10-CM | POA: Insufficient documentation

## 2012-08-03 DIAGNOSIS — F329 Major depressive disorder, single episode, unspecified: Secondary | ICD-10-CM | POA: Insufficient documentation

## 2012-08-03 DIAGNOSIS — K047 Periapical abscess without sinus: Secondary | ICD-10-CM | POA: Insufficient documentation

## 2012-08-03 DIAGNOSIS — F17200 Nicotine dependence, unspecified, uncomplicated: Secondary | ICD-10-CM | POA: Insufficient documentation

## 2012-08-03 DIAGNOSIS — R599 Enlarged lymph nodes, unspecified: Secondary | ICD-10-CM | POA: Insufficient documentation

## 2012-08-03 HISTORY — DX: Schizophrenia, unspecified: F20.9

## 2012-08-03 HISTORY — DX: Anxiety disorder, unspecified: F41.9

## 2012-08-03 MED ORDER — CLINDAMYCIN PHOSPHATE 900 MG/50ML IV SOLN
900.0000 mg | Freq: Once | INTRAVENOUS | Status: AC
  Administered 2012-08-03: 900 mg via INTRAVENOUS
  Filled 2012-08-03: qty 50

## 2012-08-03 MED ORDER — OXYCODONE-ACETAMINOPHEN 5-325 MG PO TABS: 1.0000 | ORAL_TABLET | Freq: Four times a day (QID) | ORAL | Status: DC | PRN

## 2012-08-03 MED ORDER — PENICILLIN V POTASSIUM 500 MG PO TABS: 500.0000 mg | ORAL_TABLET | Freq: Three times a day (TID) | ORAL | Status: DC

## 2012-08-03 MED ORDER — BUPIVACAINE HCL (PF) 0.5 % IJ SOLN
10.0000 mL | Freq: Once | INTRAMUSCULAR | Status: DC
  Filled 2012-08-03: qty 10

## 2012-08-03 MED ORDER — LIDOCAINE HCL (PF) 1 % IJ SOLN: 5.0000 mL | Freq: Once | INTRAMUSCULAR | Status: DC

## 2012-08-03 NOTE — ED Notes (Signed)
Per ems: pt with multiple c/o pain. Reports over past 2 years he has been having liver pain, back pain, tooth pain, body aches. En route, A&Ox4, VSS.

## 2012-08-03 NOTE — ED Provider Notes (Signed)
History     CSN: [id number]  Arrival date & time 08/03/12  1730   First MD Initiated Contact with Patient 08/03/12 2029      Chief Complaint  Patient presents with  . Generalized Body Aches    (Consider location/radiation/quality/duration/timing/severity/associated sxs/prior treatment) HPI Comments: Patient was seen for a brain ninth for a dental abscess, treated with penicillin, and Percocet.  He never followed up with a dentist now presents with right lower jaw pain and swelling to his oral lack of tooth as previously.  He noticed, the swelling, increased 2, days ago.  Denies any fever, nausea, vomiting, ear pain, headache  The history is provided by the patient.    Past Medical History  Diagnosis Date  . Hepatitis C   . Depression   . Schizophrenia   . Depression   . Anxiety     History reviewed. No pertinent past surgical history.  History reviewed. No pertinent family history.  History  Substance Use Topics  . Smoking status: Current Every Day Smoker -- 0.50 packs/day  . Smokeless tobacco: Not on file  . Alcohol Use: Yes     Comment: former alcohol use      Review of Systems  HENT: Positive for facial swelling and dental problem. Negative for drooling, mouth sores and trouble swallowing.   Respiratory: Negative for shortness of breath.   Gastrointestinal: Negative for vomiting.  Skin: Negative for wound.  All other systems reviewed and are negative.    Allergies  Review of patient's allergies indicates no known allergies.  Home Medications   Current Outpatient Rx  Name  Route  Sig  Dispense  Refill  . oxyCODONE-acetaminophen (PERCOCET/ROXICET) 5-325 MG per tablet   Oral   Take 1 tablet by mouth every 6 (six) hours as needed for pain.   9 tablet   0   . penicillin v potassium (VEETID) 500 MG tablet   Oral   Take 1 tablet (500 mg total) by mouth 3 (three) times daily.   30 tablet   0     BP 126/87  Pulse 87  Temp(Src) 98.4 F (36.9 C)  (Oral)  Resp 18  Ht 5\' 11"  (1.803 m)  Wt 160 lb (72.576 kg)  BMI 22.33 kg/m2  SpO2 100%  Physical Exam  Constitutional: He is oriented to person, place, and time. He appears well-developed and well-nourished.  HENT:  Head: Normocephalic.    Eyes: Pupils are equal, round, and reactive to light.  Neck: Normal range of motion.  Cardiovascular: Normal rate.   Pulmonary/Chest: Effort normal.  Abdominal: Soft. Bowel sounds are normal.  Musculoskeletal: Normal range of motion.  Lymphadenopathy:    He has cervical adenopathy.  Neurological: He is alert and oriented to person, place, and time.  Skin: Skin is warm.    ED Course  Dental Date/Time: 08/03/2012 9:18 PM Performed by: Arman Filter Authorized by: Arman Filter Consent: Verbal consent obtained. Risks and benefits: risks, benefits and alternatives were discussed Consent given by: patient Patient understanding: patient states understanding of the procedure being performed Patient identity confirmed: verbally with patient Time out: Immediately prior to procedure a "time out" was called to verify the correct patient, procedure, equipment, support staff and site/side marked as required. Local anesthesia used: yes Anesthesia: nerve block Patient sedated: no Patient tolerance: Patient tolerated the procedure well with no immediate complications. Comments: 3cc of 1% lidiocaine and .5% Sensocaine mix 60/40    (including critical care time)  Labs Reviewed -  No data to display No results found.   1. Dental abscess       MDM          Arman Filter, NP 08/03/12 2257

## 2012-08-07 NOTE — ED Provider Notes (Signed)
History/physical exam/procedure(s) were performed by non-physician practitioner and as supervising physician I was immediately available for consultation/collaboration. I have reviewed all notes and am in agreement with care and plan.   Hilario Quarry, MD 08/07/12 1550

## 2012-09-02 ENCOUNTER — Encounter (HOSPITAL_COMMUNITY): Payer: Self-pay | Admitting: *Deleted

## 2012-09-02 ENCOUNTER — Emergency Department (HOSPITAL_COMMUNITY)
Admission: EM | Admit: 2012-09-02 | Discharge: 2012-09-03 | Disposition: A | Discharge status: Home Health | Payer: Self-pay | Attending: Emergency Medicine | Admitting: Emergency Medicine

## 2012-09-02 DIAGNOSIS — F17200 Nicotine dependence, unspecified, uncomplicated: Secondary | ICD-10-CM | POA: Insufficient documentation

## 2012-09-02 DIAGNOSIS — F10929 Alcohol use, unspecified with intoxication, unspecified: Secondary | ICD-10-CM

## 2012-09-02 DIAGNOSIS — Z8619 Personal history of other infectious and parasitic diseases: Secondary | ICD-10-CM | POA: Insufficient documentation

## 2012-09-02 DIAGNOSIS — R45851 Suicidal ideations: Secondary | ICD-10-CM

## 2012-09-02 DIAGNOSIS — F1010 Alcohol abuse, uncomplicated: Secondary | ICD-10-CM | POA: Insufficient documentation

## 2012-09-02 DIAGNOSIS — Z8659 Personal history of other mental and behavioral disorders: Secondary | ICD-10-CM | POA: Insufficient documentation

## 2012-09-02 LAB — COMPREHENSIVE METABOLIC PANEL WITH GFR
ALT: 12 U/L (ref 0–53)
AST: 16 U/L (ref 0–37)
Albumin: 4.1 g/dL (ref 3.5–5.2)
Alkaline Phosphatase: 108 U/L (ref 39–117)
BUN: 9 mg/dL (ref 6–23)
CO2: 25 meq/L (ref 19–32)
Calcium: 9.2 mg/dL (ref 8.4–10.5)
Chloride: 102 meq/L (ref 96–112)
Creatinine, Ser: 0.91 mg/dL (ref 0.50–1.35)
GFR calc Af Amer: >90 mL/min
GFR calc non Af Amer: >90 mL/min
Glucose, Bld: 92 mg/dL (ref 70–99)
Potassium: 3.7 meq/L (ref 3.5–5.1)
Sodium: 140 meq/L (ref 135–145)
Total Bilirubin: 0.2 mg/dL — ABNORMAL LOW (ref 0.3–1.2)
Total Protein: 8.2 g/dL (ref 6.0–8.3)

## 2012-09-02 LAB — CBC
Hemoglobin: 15.4 g/dL (ref 13.0–17.0)
MCV: 90.3 fL (ref 78.0–100.0)
Platelets: 406 10*3/uL — ABNORMAL HIGH (ref 150–400)
RBC: 4.76 MIL/uL (ref 4.22–5.81)
WBC: 10.2 10*3/uL (ref 4.0–10.5)

## 2012-09-02 LAB — SALICYLATE LEVEL: Salicylate Lvl: <2 mg/dL — ABNORMAL LOW (ref 2.8–20.0)

## 2012-09-02 LAB — ACETAMINOPHEN LEVEL: Acetaminophen (Tylenol), Serum: <15 ug/mL (ref 10–30)

## 2012-09-02 NOTE — ED Notes (Signed)
Charge RN made aware that the patient is agitated and yelling curse words at people passing by his room.  Advised that the patient was getting aggressive with me.  Security and EDP made aware of patient's behavior.  Security to come to bedside.

## 2012-09-02 NOTE — ED Notes (Signed)
[employer] is at the bedside.  

## 2012-09-02 NOTE — ED Notes (Signed)
Pt states he came in because his "girlfriend told him to".  Says he is feeling "looney-tooney", when asked if he is feeling suicidal or homicidal-pt states "maybe" to both.  Also states he is hearing voices who "tell me don't do this".  Also c/o pain all over.

## 2012-09-03 ENCOUNTER — Encounter (HOSPITAL_COMMUNITY): Payer: Self-pay | Admitting: Emergency Medicine

## 2012-09-03 LAB — RAPID URINE DRUG SCREEN, HOSP PERFORMED
Amphetamines: NOT DETECTED
Barbiturates: NOT DETECTED
Tetrahydrocannabinol: NOT DETECTED

## 2012-09-03 MED ORDER — LORAZEPAM 1 MG PO TABS: 0.0000 mg | ORAL_TABLET | Freq: Four times a day (QID) | ORAL | Status: DC

## 2012-09-03 MED ORDER — SODIUM CHLORIDE 0.9 % IV SOLN: INTRAVENOUS | Status: DC

## 2012-09-03 MED ORDER — IBUPROFEN 400 MG PO TABS: 600.0000 mg | ORAL_TABLET | Freq: Three times a day (TID) | ORAL | Status: DC | PRN

## 2012-09-03 MED ORDER — NICOTINE 21 MG/24HR TD PT24: 21.0000 mg | MEDICATED_PATCH | Freq: Every day | TRANSDERMAL | Status: DC

## 2012-09-03 MED ORDER — LORAZEPAM 2 MG/ML IJ SOLN: 1.0000 mg | Freq: Four times a day (QID) | INTRAMUSCULAR | Status: DC | PRN

## 2012-09-03 MED ORDER — LORAZEPAM 1 MG PO TABS: 1.0000 mg | ORAL_TABLET | Freq: Three times a day (TID) | ORAL | Status: DC | PRN

## 2012-09-03 MED ORDER — LORAZEPAM 1 MG PO TABS: 0.0000 mg | ORAL_TABLET | Freq: Two times a day (BID) | ORAL | Status: DC

## 2012-09-03 MED ORDER — ALUM & MAG HYDROXIDE-SIMETH 200-200-20 MG/5ML PO SUSP: 30.0000 mL | ORAL | Status: DC | PRN

## 2012-09-03 MED ORDER — THIAMINE HCL 100 MG/ML IJ SOLN: 100.0000 mg | Freq: Every day | INTRAMUSCULAR | Status: DC

## 2012-09-03 MED ORDER — FOLIC ACID 1 MG PO TABS: 1.0000 mg | ORAL_TABLET | Freq: Every day | ORAL | Status: DC

## 2012-09-03 MED ORDER — ONDANSETRON HCL 8 MG PO TABS: 4.0000 mg | ORAL_TABLET | Freq: Three times a day (TID) | ORAL | Status: DC | PRN

## 2012-09-03 MED ORDER — VITAMIN B-1 100 MG PO TABS: 100.0000 mg | ORAL_TABLET | Freq: Every day | ORAL | Status: DC

## 2012-09-03 MED ORDER — ADULT MULTIVITAMIN W/MINERALS CH: 1.0000 | ORAL_TABLET | Freq: Every day | ORAL | Status: DC

## 2012-09-03 MED ORDER — ZIPRASIDONE MESYLATE 20 MG IM SOLR
20.0000 mg | Freq: Once | INTRAMUSCULAR | Status: AC
  Administered 2012-09-03: 20 mg via INTRAMUSCULAR
  Filled 2012-09-03: qty 20

## 2012-09-03 MED ORDER — LORAZEPAM 1 MG PO TABS: 1.0000 mg | ORAL_TABLET | Freq: Four times a day (QID) | ORAL | Status: DC | PRN

## 2012-09-03 NOTE — ED Notes (Signed)
ACT TEAM IN TO EVAL PT

## 2012-09-03 NOTE — ED Notes (Signed)
SOCIAL WORKER HAS SEEN PT AND GIVEN HIM RESOURCES

## 2012-09-03 NOTE — ED Notes (Signed)
Dr Silverio Lay in to evaluate patient

## 2012-09-03 NOTE — ED Notes (Signed)
Pt very aggitated and assessment not able to be done.

## 2012-09-03 NOTE — ED Notes (Signed)
MD at bedside. 

## 2012-09-03 NOTE — ED Notes (Addendum)
Spoke to wife on the phone, states that pt admitted to her that he was suicidal. pt wife wants updates [phone] Jacqulynn Cadet

## 2012-09-03 NOTE — ED Provider Notes (Addendum)
Patient not intoxicated this AM. Feeling better, has some chronic back pain. No issues as per nursing. Patient denies suicidal or homicidal ideations. Doesn't want detox. Will have ACT eval.   Richardean Canal, MD 09/03/12 0719  7:48 AM ACT evaluated, given resources. Not suicidal or homicidal.   Richardean Canal, MD 09/03/12 [phone]
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
## 2012-09-03 NOTE — Progress Notes (Signed)
Clinical Social Work Department BRIEF PSYCHOSOCIAL ASSESSMENT 09/03/2012  Patient:  Alfred Jackson, Alfred Jackson     Account Number:  [id number]     Admit date:  09/02/2012  Clinical Social Worker:  Leron Croak, CLINICAL SOCIAL WORKER  Date/Time:  09/03/2012 08:36 AM  Referred by:  Physician  Date Referred:  09/03/2012 Referred for  Substance Abuse   Other Referral:   Interview type:  Patient Other interview type:    PSYCHOSOCIAL DATA Living Status:  SIGNIFICANT OTHER Admitted from facility:   Level of care:   Primary support name:  Louretta Shorten Primary support relationship to patient:  PARTNER Degree of support available:   Pt has some support    CURRENT CONCERNS Current Concerns  Substance Abuse  Financial Resources   Other Concerns:    SOCIAL WORK ASSESSMENT / PLAN CSW met with the Pt at the bedside to discuss Pt concerns with finances. Pt was expecting CSW and stated the he "needs help with finances". Pt stated he has filed for disabiltiy (SSI) and is waiting to hear back from them. Pt is unemployed and his disability claim is for depression and anxiety. Pt did not endorse a problem with his alocohol and claimed that his significant other was the one who made him come to the ED. Pt stated he did not "do anything" to cause her to send him here" and "it is her that has the problem". Pt does not want to consider treatment at this time. Pt did accept financial resource information, food pantry, and alcohol and substance abuse treatment resources.  No further needs at this time.   Assessment/plan status:  Information/Referral to [facility] Other assessment/ plan:   Information/referral to community resources:   CSW gave resources listed above.    PATIENT'S/FAMILY'S RESPONSE TO PLAN OF CARE: Pt appreciative for assistance and stated he is ready to leave.       Leron Croak, LCSWA [facility] Emergency Dept.  [phone]
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
## 2012-09-13 ENCOUNTER — Emergency Department (HOSPITAL_COMMUNITY)
Admission: EM | Admit: 2012-09-13 | Discharge: 2012-09-14 | Disposition: A | Discharge status: Home / Self Care | Payer: Self-pay | Attending: Emergency Medicine | Admitting: Emergency Medicine

## 2012-09-13 DIAGNOSIS — Y92009 Unspecified place in unspecified non-institutional (private) residence as the place of occurrence of the external cause: Secondary | ICD-10-CM | POA: Insufficient documentation

## 2012-09-13 DIAGNOSIS — Z8659 Personal history of other mental and behavioral disorders: Secondary | ICD-10-CM | POA: Insufficient documentation

## 2012-09-13 DIAGNOSIS — F17200 Nicotine dependence, unspecified, uncomplicated: Secondary | ICD-10-CM | POA: Insufficient documentation

## 2012-09-13 DIAGNOSIS — S91012A Laceration without foreign body, left ankle, initial encounter: Secondary | ICD-10-CM

## 2012-09-13 DIAGNOSIS — Y939 Activity, unspecified: Secondary | ICD-10-CM | POA: Insufficient documentation

## 2012-09-13 DIAGNOSIS — Z8619 Personal history of other infectious and parasitic diseases: Secondary | ICD-10-CM | POA: Insufficient documentation

## 2012-09-13 DIAGNOSIS — S91009A Unspecified open wound, unspecified ankle, initial encounter: Secondary | ICD-10-CM | POA: Insufficient documentation

## 2012-09-13 DIAGNOSIS — W208XXA Other cause of strike by thrown, projected or falling object, initial encounter: Secondary | ICD-10-CM | POA: Insufficient documentation

## 2012-09-13 DIAGNOSIS — S81009A Unspecified open wound, unspecified knee, initial encounter: Secondary | ICD-10-CM | POA: Insufficient documentation

## 2012-09-13 NOTE — ED Notes (Signed)
Pt cut his LT lower leg with angle grinder. Dsy stained in arrival to ED and bleeding controled. Pt ambulatory from his home to EMS truck as reported by EMS. Pt is able to move all toes and pulse present.

## 2012-09-13 NOTE — ED Notes (Signed)
Pt to xray via stretcher, bleeding controlled.

## 2012-09-14 ENCOUNTER — Emergency Department (HOSPITAL_COMMUNITY): Payer: Self-pay

## 2012-09-14 ENCOUNTER — Emergency Department (HOSPITAL_COMMUNITY)
Admission: EM | Admit: 2012-09-14 | Discharge: 2012-09-15 | Disposition: A | Discharge status: Home / Self Care | Payer: Self-pay | Attending: Emergency Medicine | Admitting: Emergency Medicine

## 2012-09-14 DIAGNOSIS — F17200 Nicotine dependence, unspecified, uncomplicated: Secondary | ICD-10-CM | POA: Insufficient documentation

## 2012-09-14 DIAGNOSIS — Z8659 Personal history of other mental and behavioral disorders: Secondary | ICD-10-CM | POA: Insufficient documentation

## 2012-09-14 DIAGNOSIS — S81009A Unspecified open wound, unspecified knee, initial encounter: Secondary | ICD-10-CM | POA: Insufficient documentation

## 2012-09-14 DIAGNOSIS — S91011D Laceration without foreign body, right ankle, subsequent encounter: Secondary | ICD-10-CM

## 2012-09-14 DIAGNOSIS — W208XXA Other cause of strike by thrown, projected or falling object, initial encounter: Secondary | ICD-10-CM | POA: Insufficient documentation

## 2012-09-14 DIAGNOSIS — Y929 Unspecified place or not applicable: Secondary | ICD-10-CM | POA: Insufficient documentation

## 2012-09-14 DIAGNOSIS — Z8619 Personal history of other infectious and parasitic diseases: Secondary | ICD-10-CM | POA: Insufficient documentation

## 2012-09-14 DIAGNOSIS — Y939 Activity, unspecified: Secondary | ICD-10-CM | POA: Insufficient documentation

## 2012-09-14 MED ORDER — CEPHALEXIN 500 MG PO CAPS: 500.0000 mg | ORAL_CAPSULE | Freq: Four times a day (QID) | ORAL | Status: DC

## 2012-09-14 MED ORDER — IBUPROFEN 400 MG PO TABS
600.0000 mg | ORAL_TABLET | Freq: Once | ORAL | Status: AC
  Administered 2012-09-14: 600 mg via ORAL

## 2012-09-14 MED ORDER — HYDROCODONE-ACETAMINOPHEN 5-325 MG PO TABS: 1.0000 | ORAL_TABLET | Freq: Once | ORAL | Status: DC

## 2012-09-14 MED ORDER — HYDROCODONE-ACETAMINOPHEN 5-325 MG PO TABS
1.0000 | ORAL_TABLET | Freq: Once | ORAL | Status: AC
  Administered 2012-09-15: 1 via ORAL
  Filled 2012-09-14 (×2): qty 1

## 2012-09-14 MED ORDER — CEPHALEXIN 250 MG PO CAPS
500.0000 mg | ORAL_CAPSULE | Freq: Once | ORAL | Status: AC
  Administered 2012-09-15: 500 mg via ORAL
  Filled 2012-09-14: qty 2

## 2012-09-14 NOTE — ED Provider Notes (Signed)
History     CSN: [id number]  Arrival date & time 09/14/12  2126   First MD Initiated Contact with Patient 09/14/12 2338      Chief Complaint  Patient presents with  . Ankle Pain    (Consider location/radiation/quality/duration/timing/severity/associated sxs/prior treatment) HPI Comments: Seen yesterday for laceration to medial L ankle now here with increased pain and erythema Has not taken any meds PTA  Patient is a 52 y.o. male presenting with ankle pain. The history is provided by the patient.  Ankle Pain Location:  Ankle Time since incident:  2 days Injury: yes   Ankle location:  R ankle Pain details:    Quality:  Aching   Radiates to:  Does not radiate   Severity:  Moderate   Onset quality:  Sudden   Timing:  Constant   Progression:  Worsening Chronicity:  New   Past Medical History  Diagnosis Date  . Hepatitis C   . Depression   . Schizophrenia   . Depression   . Anxiety     No past surgical history on file.  No family history on file.  History  Substance Use Topics  . Smoking status: Current Every Day Smoker -- 0.50 packs/day  . Smokeless tobacco: Not on file  . Alcohol Use: Yes     Comment: current ETOH level 205 on 09/02/12      Review of Systems  Musculoskeletal: Positive for joint swelling.  Skin: Positive for color change and wound.  All other systems reviewed and are negative.    Allergies  Review of patient's allergies indicates no known allergies.  Home Medications   Current Outpatient Rx  Name  Route  Sig  Dispense  Refill  . cephALEXin (KEFLEX) 500 MG capsule   Oral   Take 1 capsule (500 mg total) by mouth 4 (four) times daily.   28 capsule   0   . HYDROcodone-acetaminophen (NORCO/VICODIN) 5-325 MG per tablet   Oral   Take 1 tablet by mouth once.   18 tablet   0     BP 138/89  Pulse 77  Temp(Src) 98 F (36.7 C) (Oral)  Resp 14  SpO2 98%  Physical Exam  Nursing note and vitals reviewed. Constitutional: He is  oriented to person, place, and time. He appears well-developed and well-nourished.  HENT:  Head: Normocephalic.  Eyes: Pupils are equal, round, and reactive to light.  Neck: Normal range of motion.  Cardiovascular: Normal rate.   Musculoskeletal: He exhibits edema and tenderness.       Feet:  Laceration with surrounding erythema   Neurological: He is alert and oriented to person, place, and time.  Skin: Skin is warm.    ED Course  Procedures (including critical care time)  Labs Reviewed - No data to display Dg Ankle Complete Left  09/14/2012  *RADIOLOGY REPORT*  Clinical Data: Laceration to left ankle.  LEFT ANKLE COMPLETE - 3+ VIEW  Comparison: None.  Findings: Radiopaque densities project over the soft tissues, likely external.  No underlying bony abnormality.  No fracture, subluxation or dislocation.  Joint spaces are maintained.  IMPRESSION: No acute bony abnormality.   Original Report Authenticated By: Charlett Nose, M.D.      1. Laceration of ankle with complication, right, subsequent encounter       MDM   Suture line intact but concern for erythema  Will start antibiotics        Arman Filter, NP 09/14/12 2349

## 2012-09-14 NOTE — ED Notes (Addendum)
Pt back from xray, room door left open, fall safety huddle done with POD B staff, fall risk explained again to pt, verbalizes understanding, also pt attempting to barter requests for drink with consequence that he will get up if he does not get drink or pain med. Pt playful. CBIR. Bleeding remains controlled. CMS intact.

## 2012-09-14 NOTE — ED Notes (Signed)
Patient presents with continued pain to medial left ankle. Was evaluated yesterday for left ankle laceration after dropping an angle grinder on it. Xray's were negative. Was given Rx ibuprofen which he has not filed. Patient is requesting pain medications and a ride home.

## 2012-09-14 NOTE — ED Provider Notes (Signed)
History     CSN: [id number]  Arrival date & time 09/13/12  2309   First MD Initiated Contact with Patient 09/13/12 2319      Chief Complaint  Patient presents with  . Laceration    lt shin    (Consider location/radiation/quality/duration/timing/severity/associated sxs/prior treatment) HPI 52 year old male presents to emergency room via EMS after sustaining left ankle laceration after dropping Angle grinder on it.  Pt reports last tetanus shot 2 years ago after cat bite.  Pt c/o pain.  EMS reports pt able to ambulate to ambulance from home.  No other injuries.  Pt appears heavily intoxicated.      Past Medical History  Diagnosis Date  . Hepatitis C   . Depression   . Schizophrenia   . Depression   . Anxiety     No past surgical history on file.  No family history on file.  History  Substance Use Topics  . Smoking status: Current Every Day Smoker -- 0.50 packs/day  . Smokeless tobacco: Not on file  . Alcohol Use: Yes     Comment: current ETOH level 205 on 09/02/12      Review of Systems  Unable to perform ROS: Other   intoxication  Allergies  Review of patient's allergies indicates no known allergies.  Home Medications  No current outpatient prescriptions on file.  BP 123/77  Pulse 98  Temp(Src) 98 F (36.7 C) (Oral)  Resp 16  SpO2 98%  Physical Exam  Nursing note and vitals reviewed. Constitutional: He appears well-developed and well-nourished. No distress.  Pulmonary/Chest: Effort normal and breath sounds normal. No respiratory distress. He has no wheezes. He has no rales. He exhibits no tenderness.  Abdominal: Soft. Bowel sounds are normal. He exhibits no distension and no mass. There is no tenderness. There is no rebound and no guarding.  Musculoskeletal: Normal range of motion. He exhibits tenderness. He exhibits no edema.  4 cm laceration to medial left ankle, bleeding controlled.  The wound explored, no foreign bodies noted.  No tendon injury.   Neurological: He is alert. He displays normal reflexes. Coordination normal.  Skin: Skin is warm and dry. No rash noted. No erythema. No pallor.    ED Course  Procedures (including critical care time)  Labs Reviewed - No data to display Dg Ankle Complete Left  09/14/2012  *RADIOLOGY REPORT*  Clinical Data: Laceration to left ankle.  LEFT ANKLE COMPLETE - 3+ VIEW  Comparison: None.  Findings: Radiopaque densities project over the soft tissues, likely external.  No underlying bony abnormality.  No fracture, subluxation or dislocation.  Joint spaces are maintained.  IMPRESSION: No acute bony abnormality.   Original Report Authenticated By: Charlett Nose, M.D.    LACERATION REPAIR Performed by: Olivia Mackie Authorized by: Olivia Mackie Consent: Verbal consent obtained. Risks and benefits: risks, benefits and alternatives were discussed Consent given by: patient Patient identity confirmed: provided demographic data Prepped and Draped in normal sterile fashion Wound explored  Laceration Location: left medial ankle   Laceration Length: 4 cm  No Foreign Bodies seen or palpated  Anesthesia: local infiltration  Local anesthetic: lidocaine 2 % with epinephrine  Anesthetic total: 4 ml  Irrigation method: syringe Amount of cleaning: standard  Skin closure: 4.0 prolene  Number of sutures: 7  Technique: 6 simple interrupted, 1 horizontal mattresss  Patient tolerance: Patient tolerated the procedure well with no immediate complications.   1. Laceration of left ankle, initial encounter       MDM  52 year old male status post laceration of medial left ankle.  X-rays without fracture.  Tetanus is up-to-date.  The wound repaired.  Patient instructed to return in 7 days for suture removal.        Olivia Mackie, MD 09/14/12 [phone]

## 2012-09-14 NOTE — ED Notes (Signed)
Lying in stretcher, foot elevated, NAD, calm, CBIR, door open.

## 2012-09-14 NOTE — ED Notes (Addendum)
pt found sitting on edge of bed hanging legs over side with foot resting on ground, bleeding from beneath bandage/ dressing applied. Bandage and dressing reapplied, bleeding controlled, CMS intact. Pt placed back in stretcher with foot elevated. Door left open. NPO and fall risk explained. Pt intoxicated and talking about drinking water, getting water and getting up. Boundaries, safety & safe choices explained. Pt took oxisensor off of finger. Alert, NAD, calm.

## 2012-09-14 NOTE — ED Notes (Signed)
Suture cart at [facility], plan explained, given ice water per request, PO fluid and pain med ordered per EDP Dr. Norlene Campbell.

## 2012-09-14 NOTE — ED Notes (Signed)
Shoes cleaned with bleach, given new pair of red socks. Wound approximated well, no bleeding at this time. bacitracin applied by MD. Xeroform, telfa & kling applied. IV d/c'd. Pt dressed self. Ambulatory with steady gait, declined w/c. given soda at d/c. Out to w/r.

## 2012-09-14 NOTE — ED Notes (Signed)
Patient seen here yesterday for the same.  C/o left ankle pain needs wound care and pain meds

## 2012-09-14 NOTE — ED Notes (Signed)
Pt wandering halls. NPO, safety, no smoking explained. Returned to bed w/o incident.

## 2012-09-15 NOTE — ED Provider Notes (Signed)
Medical screening examination/treatment/procedure(s) were performed by non-physician practitioner and as supervising physician I was immediately available for consultation/collaboration.  Olivia Mackie, MD 09/15/12 [phone]

## 2012-09-21 ENCOUNTER — Emergency Department (HOSPITAL_COMMUNITY)
Admission: EM | Admit: 2012-09-21 | Discharge: 2012-09-21 | Disposition: A | Discharge status: Home / Self Care | Payer: Self-pay | Attending: Emergency Medicine | Admitting: Emergency Medicine

## 2012-09-21 ENCOUNTER — Encounter (HOSPITAL_COMMUNITY): Payer: Self-pay | Admitting: *Deleted

## 2012-09-21 DIAGNOSIS — Z8659 Personal history of other mental and behavioral disorders: Secondary | ICD-10-CM | POA: Insufficient documentation

## 2012-09-21 DIAGNOSIS — F17200 Nicotine dependence, unspecified, uncomplicated: Secondary | ICD-10-CM | POA: Insufficient documentation

## 2012-09-21 DIAGNOSIS — Z4802 Encounter for removal of sutures: Secondary | ICD-10-CM

## 2012-09-21 DIAGNOSIS — Z8619 Personal history of other infectious and parasitic diseases: Secondary | ICD-10-CM | POA: Insufficient documentation

## 2012-09-21 NOTE — ED Provider Notes (Signed)
History     CSN: [id number]  Arrival date & time 09/21/12  2952   First MD Initiated Contact with Patient 09/21/12 1053      Chief Complaint  Patient presents with  . Suture / Staple Removal    (Consider location/radiation/quality/duration/timing/severity/associated sxs/prior treatment) HPI Pt is a 52yo male presenting for suture removal 8 days post left ankle lac.  Sutures were placed here at [facility].  Pt states pain has decreased since initial encounter and appears to be healing well (per pt).  Denies fever, n/v/d.  Denies warmth, red streaking, or drainage at the site.  Concerned for ride home.  Last time he was here he had to walk home.  Pt is requesting bus pass.    Past Medical History  Diagnosis Date  . Hepatitis C   . Depression   . Schizophrenia   . Depression   . Anxiety     History reviewed. No pertinent past surgical history.  No family history on file.  History  Substance Use Topics  . Smoking status: Current Every Day Smoker -- 0.50 packs/day  . Smokeless tobacco: Not on file  . Alcohol Use: Yes     Comment: current ETOH level 205 on 09/02/12      Review of Systems  Constitutional: Negative for fever and chills.  Musculoskeletal: Positive for joint swelling.  Skin: Positive for wound.    Allergies  Review of patient's allergies indicates no known allergies.  Home Medications   Current Outpatient Rx  Name  Route  Sig  Dispense  Refill  . cephALEXin (KEFLEX) 500 MG capsule   Oral   Take 500 mg by mouth 4 (four) times daily.         Marland Kitchen HYDROcodone-acetaminophen (NORCO/VICODIN) 5-325 MG per tablet   Oral   Take 1 tablet by mouth once.           BP 145/81  Pulse 66  Temp(Src) 98.2 F (36.8 C) (Oral)  Resp 18  SpO2 100%  Physical Exam  Vitals reviewed. Constitutional: He appears well-developed and well-nourished. No distress.  HENT:  Head: Normocephalic and atraumatic.  Eyes: Conjunctivae are normal. No scleral icterus.  Neck:  Normal range of motion.  Cardiovascular: Normal rate, regular rhythm and normal heart sounds.   Pulmonary/Chest: Effort normal and breath sounds normal. No respiratory distress. He has no wheezes.  Musculoskeletal: Normal range of motion. He exhibits tenderness ( left medial ankle over laceration). He exhibits no edema.  Neurological: He is alert.  Skin: Skin is warm and dry. He is not diaphoretic.  Healing laceration on left medial ankle.  Mild erythema around wound with dried blood over top.  No warmth or purulent discharge.  No red streaking.    Psychiatric: He has a normal mood and affect. His behavior is normal.    ED Course  SUTURE REMOVAL Date/Time: 09/21/2012 11:32 AM Performed by: Junius Finner Authorized by: Junius Finner Consent: Verbal consent obtained. Risks and benefits: risks, benefits and alternatives were discussed Consent given by: patient Patient understanding: patient states understanding of the procedure being performed Patient consent: the patient's understanding of the procedure matches consent given Patient identity confirmed: verbally with patient Body area: lower extremity Location details: left ankle Wound Appearance: red and tender Sutures Removed: 7 Post-removal: antibiotic ointment applied and dressing applied Facility: sutures placed in this facility Patient tolerance: Patient tolerated the procedure well with no immediate complications.   (including critical care time)  Labs Reviewed - No data to  display No results found.   1. Visit for suture removal       MDM  Pt here for suture removal.  7 sutures removed.  See procedure note.  Pt denies fever.  States wound has improved in appearance.  Still tender to touch but no red streaking or discharge.  Pt education on wound care post suture removal provided to pt.  Return precautions given for signs of infection.  Vitals: unremarkable. Discharged in stable condition.                Junius Finner, PA-C 09/21/12 1143

## 2012-09-21 NOTE — ED Provider Notes (Signed)
Medical screening examination/treatment/procedure(s) were performed by non-physician practitioner and as supervising physician I was immediately available for consultation/collaboration.   Richardean Canal, MD 09/21/12 [phone]

## 2012-09-21 NOTE — ED Notes (Signed)
Pt is here to have left ankle sutures removed and they have been in 8 days,.  Site is red, but patient states it looks better

## 2012-09-23 ENCOUNTER — Emergency Department: Payer: Self-pay | Admitting: Emergency Medicine

## 2012-09-23 LAB — CBC
HCT: 40.6 % (ref 40.0–52.0)
HGB: 13.9 g/dL (ref 13.0–18.0)
MCH: 31.9 pg (ref 26.0–34.0)
MCV: 93 fL (ref 80–100)
RDW: 13.6 % (ref 11.5–14.5)
WBC: 7.9 10*3/uL (ref 3.8–10.6)

## 2012-09-23 LAB — COMPREHENSIVE METABOLIC PANEL
Calcium, Total: 8.8 mg/dL (ref 8.5–10.1)
Chloride: 108 mmol/L — ABNORMAL HIGH (ref 98–107)
EGFR (Non-African Amer.): >60
Osmolality: 277 (ref 275–301)
Potassium: 3.6 mmol/L (ref 3.5–5.1)
SGPT (ALT): 47 U/L (ref 12–78)
Sodium: 139 mmol/L (ref 136–145)

## 2012-09-23 LAB — ETHANOL: Ethanol: 125 mg/dL

## 2012-09-23 LAB — TSH: Thyroid Stimulating Horm: 2.58 u[IU]/mL

## 2012-09-24 LAB — URINALYSIS, COMPLETE
Blood: NEGATIVE
Glucose,UR: NEGATIVE mg/dL (ref 0–75)
Ketone: NEGATIVE
Leukocyte Esterase: NEGATIVE
Nitrite: NEGATIVE
Ph: 9 (ref 4.5–8.0)
Protein: NEGATIVE
RBC,UR: 3 /HPF (ref 0–5)
Specific Gravity: 1.019 (ref 1.003–1.030)

## 2012-09-24 LAB — DRUG SCREEN, URINE
Benzodiazepine, Ur Scrn: POSITIVE (ref ?–200)
Cocaine Metabolite,Ur ~~LOC~~: NEGATIVE (ref ?–300)
MDMA (Ecstasy)Ur Screen: NEGATIVE (ref ?–500)
Opiate, Ur Screen: NEGATIVE (ref ?–300)
Phencyclidine (PCP) Ur S: NEGATIVE (ref ?–25)

## 2012-09-29 LAB — CULTURE, BLOOD (SINGLE)

## 2012-11-17 ENCOUNTER — Encounter (HOSPITAL_COMMUNITY): Payer: Self-pay | Admitting: Emergency Medicine

## 2012-11-17 ENCOUNTER — Emergency Department (HOSPITAL_COMMUNITY)
Admission: EM | Admit: 2012-11-17 | Discharge: 2012-11-18 | Disposition: A | Discharge status: Home / Self Care | Payer: Self-pay | Attending: Emergency Medicine | Admitting: Emergency Medicine

## 2012-11-17 DIAGNOSIS — Z8619 Personal history of other infectious and parasitic diseases: Secondary | ICD-10-CM | POA: Insufficient documentation

## 2012-11-17 DIAGNOSIS — F329 Major depressive disorder, single episode, unspecified: Secondary | ICD-10-CM | POA: Insufficient documentation

## 2012-11-17 DIAGNOSIS — Z9114 Patient's other noncompliance with medication regimen: Secondary | ICD-10-CM

## 2012-11-17 DIAGNOSIS — F1010 Alcohol abuse, uncomplicated: Secondary | ICD-10-CM | POA: Insufficient documentation

## 2012-11-17 DIAGNOSIS — F17200 Nicotine dependence, unspecified, uncomplicated: Secondary | ICD-10-CM | POA: Insufficient documentation

## 2012-11-17 DIAGNOSIS — Z9119 Patient's noncompliance with other medical treatment and regimen: Secondary | ICD-10-CM | POA: Insufficient documentation

## 2012-11-17 DIAGNOSIS — R443 Hallucinations, unspecified: Secondary | ICD-10-CM | POA: Insufficient documentation

## 2012-11-17 DIAGNOSIS — Z91199 Patient's noncompliance with other medical treatment and regimen due to unspecified reason: Secondary | ICD-10-CM | POA: Insufficient documentation

## 2012-11-17 DIAGNOSIS — F209 Schizophrenia, unspecified: Secondary | ICD-10-CM | POA: Insufficient documentation

## 2012-11-17 DIAGNOSIS — F3289 Other specified depressive episodes: Secondary | ICD-10-CM | POA: Insufficient documentation

## 2012-11-17 DIAGNOSIS — F1290 Cannabis use, unspecified, uncomplicated: Secondary | ICD-10-CM

## 2012-11-17 DIAGNOSIS — F411 Generalized anxiety disorder: Secondary | ICD-10-CM | POA: Insufficient documentation

## 2012-11-17 DIAGNOSIS — F1210 Cannabis abuse, uncomplicated: Secondary | ICD-10-CM | POA: Insufficient documentation

## 2012-11-17 DIAGNOSIS — R440 Auditory hallucinations: Secondary | ICD-10-CM

## 2012-11-17 DIAGNOSIS — F1310 Sedative, hypnotic or anxiolytic abuse, uncomplicated: Secondary | ICD-10-CM | POA: Insufficient documentation

## 2012-11-17 DIAGNOSIS — F10920 Alcohol use, unspecified with intoxication, uncomplicated: Secondary | ICD-10-CM

## 2012-11-17 HISTORY — DX: Alcohol abuse, uncomplicated: F10.10

## 2012-11-17 NOTE — ED Notes (Signed)
PT. REQUESTING DETOX FOR ETOH ABUSE , ALSO REPORTS AUDITORY /VISUAL HALLUCINATIONS FOR SEVERAL DAYS , DENIES SUICIDAL IDEATION . LAST ALCOHOL INTAKE THIS EVENING .

## 2012-11-17 NOTE — ED Notes (Signed)
PT. UNABLE TO LOCATE AT TRIAGE AND WAITING AREA.

## 2012-11-17 NOTE — ED Notes (Signed)
Called for pt. X 2 and no response and no where to be found.

## 2012-11-18 ENCOUNTER — Emergency Department (HOSPITAL_COMMUNITY): Payer: Self-pay

## 2012-11-18 LAB — COMPREHENSIVE METABOLIC PANEL
AST: 14 U/L (ref 0–37)
BUN: 10 mg/dL (ref 6–23)
CO2: 23 mEq/L (ref 19–32)
Calcium: 8.7 mg/dL (ref 8.4–10.5)
Chloride: 102 mEq/L (ref 96–112)
Creatinine, Ser: 1.38 mg/dL — ABNORMAL HIGH (ref 0.50–1.35)
GFR calc Af Amer: 67 mL/min — ABNORMAL LOW (ref >90)
GFR calc non Af Amer: 58 mL/min — ABNORMAL LOW (ref >90)
Glucose, Bld: 99 mg/dL (ref 70–99)
Total Bilirubin: 0.2 mg/dL — ABNORMAL LOW (ref 0.3–1.2)

## 2012-11-18 LAB — CBC WITH DIFFERENTIAL/PLATELET
Basophils Absolute: 0.1 10*3/uL (ref 0.0–0.1)
Eosinophils Relative: 3 % (ref 0–5)
HCT: 43.4 % (ref 39.0–52.0)
Hemoglobin: 15.1 g/dL (ref 13.0–17.0)
Lymphocytes Relative: 43 % (ref 12–46)
Lymphs Abs: 3.2 10*3/uL (ref 0.7–4.0)
MCV: 93.1 fL (ref 78.0–100.0)
Monocytes Absolute: 0.4 10*3/uL (ref 0.1–1.0)
Monocytes Relative: 5 % (ref 3–12)
Neutro Abs: 3.7 10*3/uL (ref 1.7–7.7)
RBC: 4.66 MIL/uL (ref 4.22–5.81)
RDW: 13.8 % (ref 11.5–15.5)
WBC: 7.6 10*3/uL (ref 4.0–10.5)

## 2012-11-18 LAB — ETHANOL: Alcohol, Ethyl (B): 135 mg/dL — ABNORMAL HIGH (ref 0–11)

## 2012-11-18 LAB — RAPID URINE DRUG SCREEN, HOSP PERFORMED
Barbiturates: NOT DETECTED
Benzodiazepines: POSITIVE — AB

## 2012-11-18 MED ORDER — LORAZEPAM 2 MG/ML IJ SOLN: 1.0000 mg | Freq: Four times a day (QID) | INTRAMUSCULAR | Status: DC | PRN

## 2012-11-18 MED ORDER — LORAZEPAM 1 MG PO TABS: 1.0000 mg | ORAL_TABLET | Freq: Four times a day (QID) | ORAL | Status: DC | PRN

## 2012-11-18 MED ORDER — VITAMIN B-1 100 MG PO TABS: 100.0000 mg | ORAL_TABLET | Freq: Every day | ORAL | Status: DC

## 2012-11-18 MED ORDER — ADULT MULTIVITAMIN W/MINERALS CH: 1.0000 | ORAL_TABLET | Freq: Every day | ORAL | Status: DC

## 2012-11-18 MED ORDER — IBUPROFEN 400 MG PO TABS: 400.0000 mg | ORAL_TABLET | Freq: Four times a day (QID) | ORAL | Status: DC | PRN

## 2012-11-18 MED ORDER — THIAMINE HCL 100 MG/ML IJ SOLN: 100.0000 mg | Freq: Every day | INTRAMUSCULAR | Status: DC

## 2012-11-18 MED ORDER — FOLIC ACID 1 MG PO TABS: 1.0000 mg | ORAL_TABLET | Freq: Every day | ORAL | Status: DC

## 2012-11-18 MED ORDER — LACTATED RINGERS IV BOLUS (SEPSIS)
1000.0000 mL | Freq: Once | INTRAVENOUS | Status: AC
  Administered 2012-11-18: 1000 mL via INTRAVENOUS

## 2012-11-18 NOTE — ED Notes (Signed)
Patient adamantly denies that he requested detox from ETOH. States that he has "mental issues." When asked about these "mental issues" patient states that he "forgot what it's called." Patient endorses auditory and visual hallucinations. States that the voices tells him to "be mean." Denies HI. States that "I don't want to hurt nobody else but I could hurt me." When asked if he wants to hurt himself, patient states "no but I could." Patient also states that he has cancer. Requesting to have his lungs and kidneys checked out. No c/o CP or SOB. No urinary sx.
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
## 2012-11-18 NOTE — ED Notes (Signed)
Portable CXR at bedside.

## 2012-11-18 NOTE — ED Notes (Signed)
ACT TEAM AT BEDSIDE 

## 2012-11-18 NOTE — ED Notes (Signed)
Charge RN notified of patient's current status

## 2012-11-18 NOTE — ED Notes (Signed)
Patient states that he went to [facility] today but it was closed. Says that he has "mental problems." Denies illicit substance use. States that he drank "a little bit" [ETOH] earlier today

## 2012-11-18 NOTE — ED Notes (Signed)
Patient placed in blue scrubs

## 2012-11-18 NOTE — ED Notes (Signed)
Pt. Has no complaints while waiting on room since triage.  Pt. Coming up to desk and telling us he is going to go smoke while waiting for a room.

## 2012-11-18 NOTE — ED Notes (Signed)
MD at bedside. 

## 2012-11-18 NOTE — ED Provider Notes (Signed)
History    CSN: [id number] Arrival date & time 11/17/12  2307  First MD Initiated Contact with Patient 11/18/12 0138     Chief Complaint  Patient presents with  . Alcohol Problem  . Hallucinations   level V caveat for history of schizophrenia, intoxication  HPI Alfred Jackson is a 52 y.o. male presenting for various complaints. Initially chief complaint was he was requesting alcohol detox however he told me he does not need alcohol detox, he says he wants his heart and lungs checked out. He is also reporting auditory and visual hallucinations for several days, he says voices are telling him to hurt people. Patient is intoxicated at this time and is a very poor historian.  Past Medical History  Diagnosis Date  . Hepatitis C   . Depression   . Schizophrenia   . Depression   . Anxiety   . Alcohol abuse    History reviewed. No pertinent past surgical history. No family history on file. History  Substance Use Topics  . Smoking status: Current Every Day Smoker -- 0.50 packs/day  . Smokeless tobacco: Not on file  . Alcohol Use: Yes     Comment: current ETOH level 205 on 09/02/12    Review of Systems Level V caveat for intoxication, schizophrenia Allergies  Review of patient's allergies indicates no known allergies.  Home Medications   Current Outpatient Rx  Name  Route  Sig  Dispense  Refill  . cephALEXin (KEFLEX) 500 MG capsule   Oral   Take 500 mg by mouth 4 (four) times daily.         Marland Kitchen HYDROcodone-acetaminophen (NORCO/VICODIN) 5-325 MG per tablet   Oral   Take 1 tablet by mouth once.          BP 93/60  Pulse 63  Temp(Src) 98.4 F (36.9 C) (Oral)  Resp 14  SpO2 96% Physical Exam  Nursing notes reviewed.  Electronic medical record reviewed. VITAL SIGNS:   Filed Vitals:   11/18/12 0216 11/18/12 0300 11/18/12 0330 11/18/12 0400  BP: 101/46 92/61 86/51  93/60  Pulse:  63 65 63  Temp:      TempSrc:      Resp:      SpO2: 95% 95% 94% 96%    CONSTITUTIONAL: Awake, oriented, appears non-toxic HENT: Atraumatic, normocephalic, oral mucosa pink and moist, airway patent. Nares patent without drainage. External ears normal. EYES: Conjunctiva clear, EOMI, PERRLA NECK: Trachea midline, non-tender, supple CARDIOVASCULAR: Normal heart rate, Normal rhythm, No murmurs, rubs, gallops PULMONARY/CHEST: Clear to auscultation, no rhonchi, wheezes, or rales. Symmetrical breath sounds. Non-tender. ABDOMINAL: Non-distended, soft, non-tender - no rebound or guarding.  BS normal. NEUROLOGIC: Non-focal, moving all four extremities, no gross sensory or motor deficits. EXTREMITIES: No clubbing, cyanosis, or edema SKIN: Warm, Dry, No erythema, No rash  ED Course  Procedures (including critical care time) Labs Reviewed  ETHANOL - Abnormal; Notable for the following:    Alcohol, Ethyl (B) 135 (*)    All other components within normal limits  URINE RAPID DRUG SCREEN (HOSP PERFORMED) - Abnormal; Notable for the following:    Benzodiazepines POSITIVE (*)    Tetrahydrocannabinol POSITIVE (*)    All other components within normal limits  COMPREHENSIVE METABOLIC PANEL - Abnormal; Notable for the following:    Potassium 3.3 (*)    Creatinine, Ser 1.38 (*)    Albumin 3.4 (*)    Total Bilirubin 0.2 (*)    GFR calc non Af Amer 58 (*)  GFR calc Af Amer 67 (*)    All other components within normal limits  CBC WITH DIFFERENTIAL  TROPONIN I   Dg Chest Port 1 View  11/18/2012   *RADIOLOGY REPORT*  Clinical Data: Chest pain and upper abdominal pain.  PORTABLE CHEST - 1 VIEW  Comparison: 05/21/2011  Findings: Mild hyperinflation.  Scattered calcified granulomas in the lungs.  Normal heart size and pulmonary vascularity.  No focal consolidation or airspace disease in the lungs.  No blunting of costophrenic angles.  No pneumothorax.  Mediastinal contours appear intact.  No significant changes since previous study.  IMPRESSION: No evidence of active pulmonary  disease.   Original Report Authenticated By: Burman Nieves, M.D.   1. Schizophrenia   2. Alcohol intoxication, uncomplicated   3. Marijuana smoker   4. Benzodiazepine abuse   5. Auditory hallucinations   6. Noncompliance with medication regimen     MDM  Patient originally arrived requesting detox from alcohol abuse then said he did not want to detox medication because he didn't need it. Also reporting auditory and visual hallucinations patient is using benzodiazepines from [facility] smoking marijuana, also is intoxicated with alcohol. In speaking with the patient after sobering up for a period, he would like to see if he can get placement for his psychiatric problems to stop the voices, he has not been taking any anti-psychotic medications.  Patient's currently medically clear. He had some lower blood pressures in the 80s and 90s systolic, while sleeping, patient was given a fluid bolus, has never been tachycardic.  Stable for placement. Discussed with ACT team for placement.   Jones Skene, MD 11/18/12 1610

## 2012-11-18 NOTE — ED Notes (Signed)
Security has wanded the patient  

## 2012-11-18 NOTE — ED Notes (Signed)
Patient belongings inventoried and secured in ED storage. Additional valuables inventoried and secured in ED security lock box #12, envelope [id number]

## 2012-11-19 ENCOUNTER — Emergency Department: Payer: Self-pay | Admitting: Emergency Medicine

## 2012-11-19 LAB — CBC
HCT: 45.2 % (ref 40.0–52.0)
HGB: 15.4 g/dL (ref 13.0–18.0)
MCH: 32.1 pg (ref 26.0–34.0)
MCV: 94 fL (ref 80–100)
Platelet: 372 10*3/uL (ref 150–440)
RBC: 4.79 10*6/uL (ref 4.40–5.90)
RDW: 13.8 % (ref 11.5–14.5)
WBC: 9.8 10*3/uL (ref 3.8–10.6)

## 2012-11-20 LAB — COMPREHENSIVE METABOLIC PANEL
Anion Gap: 5 — ABNORMAL LOW (ref 7–16)
Bilirubin,Total: 0.4 mg/dL (ref 0.2–1.0)
Calcium, Total: 8.6 mg/dL (ref 8.5–10.1)
Co2: 26 mmol/L (ref 21–32)
EGFR (African American): >60
EGFR (Non-African Amer.): >60
Glucose: 80 mg/dL (ref 65–99)
SGOT(AST): 21 U/L (ref 15–37)
SGPT (ALT): 22 U/L (ref 12–78)
Sodium: 140 mmol/L (ref 136–145)

## 2012-11-20 LAB — DRUG SCREEN, URINE
Amphetamines, Ur Screen: NEGATIVE (ref ?–1000)
Benzodiazepine, Ur Scrn: POSITIVE (ref ?–200)
Cannabinoid 50 Ng, Ur ~~LOC~~: NEGATIVE (ref ?–50)
Cocaine Metabolite,Ur ~~LOC~~: NEGATIVE (ref ?–300)
Methadone, Ur Screen: NEGATIVE (ref ?–300)
Opiate, Ur Screen: NEGATIVE (ref ?–300)
Phencyclidine (PCP) Ur S: NEGATIVE (ref ?–25)
Tricyclic, Ur Screen: NEGATIVE (ref ?–1000)

## 2012-11-20 LAB — URINALYSIS, COMPLETE
Bilirubin,UR: NEGATIVE
Glucose,UR: NEGATIVE mg/dL (ref 0–75)
Leukocyte Esterase: NEGATIVE
Protein: NEGATIVE
RBC,UR: NONE SEEN /HPF (ref 0–5)
Squamous Epithelial: <1
WBC UR: NONE SEEN /HPF (ref 0–5)

## 2012-11-20 LAB — TSH: Thyroid Stimulating Horm: 4.82 u[IU]/mL — ABNORMAL HIGH

## 2012-12-03 ENCOUNTER — Emergency Department (HOSPITAL_COMMUNITY)
Admission: EM | Admit: 2012-12-03 | Discharge: 2012-12-04 | Discharge status: Against Medical Advice | Payer: Self-pay | Attending: Emergency Medicine | Admitting: Emergency Medicine

## 2012-12-03 ENCOUNTER — Encounter (HOSPITAL_COMMUNITY): Payer: Self-pay | Admitting: *Deleted

## 2012-12-03 DIAGNOSIS — Z008 Encounter for other general examination: Secondary | ICD-10-CM | POA: Insufficient documentation

## 2012-12-03 DIAGNOSIS — F17200 Nicotine dependence, unspecified, uncomplicated: Secondary | ICD-10-CM | POA: Insufficient documentation
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
## 2012-12-03 NOTE — ED Notes (Addendum)
Pt brought in by sheriff; states is voluntary; etoh; per officer pt wants detox; pt denied SI/HI

## 2012-12-04 ENCOUNTER — Emergency Department: Payer: Self-pay | Admitting: Emergency Medicine

## 2012-12-04 LAB — URINALYSIS, COMPLETE
Glucose,UR: NEGATIVE mg/dL (ref 0–75)
Ketone: NEGATIVE
Nitrite: NEGATIVE
Ph: 5 (ref 4.5–8.0)
Protein: NEGATIVE
RBC,UR: NONE SEEN /HPF (ref 0–5)
Squamous Epithelial: NONE SEEN
WBC UR: <1 /HPF (ref 0–5)

## 2012-12-04 LAB — CBC
HCT: 43.5 % (ref 40.0–52.0)
HGB: 14.9 g/dL (ref 13.0–18.0)
MCH: 32.2 pg (ref 26.0–34.0)
RDW: 14.1 % (ref 11.5–14.5)
WBC: 11.5 10*3/uL — ABNORMAL HIGH (ref 3.8–10.6)

## 2012-12-04 LAB — DRUG SCREEN, URINE
Amphetamines, Ur Screen: NEGATIVE (ref ?–1000)
Cocaine Metabolite,Ur ~~LOC~~: NEGATIVE (ref ?–300)
Opiate, Ur Screen: NEGATIVE (ref ?–300)
Tricyclic, Ur Screen: NEGATIVE (ref ?–1000)

## 2012-12-04 LAB — COMPREHENSIVE METABOLIC PANEL
Anion Gap: 4 — ABNORMAL LOW (ref 7–16)
Bilirubin,Total: 0.2 mg/dL (ref 0.2–1.0)
Chloride: 104 mmol/L (ref 98–107)
EGFR (African American): >60
EGFR (Non-African Amer.): >60
Glucose: 101 mg/dL — ABNORMAL HIGH (ref 65–99)
Osmolality: 272 (ref 275–301)
Potassium: 3.6 mmol/L (ref 3.5–5.1)
SGOT(AST): 27 U/L (ref 15–37)
Sodium: 137 mmol/L (ref 136–145)

## 2012-12-04 LAB — ETHANOL
Ethanol %: 0.185 % — ABNORMAL HIGH (ref 0.000–0.080)
Ethanol: 185 mg/dL

## 2012-12-25 ENCOUNTER — Encounter (HOSPITAL_COMMUNITY): Payer: Self-pay | Admitting: Emergency Medicine

## 2012-12-25 ENCOUNTER — Emergency Department (HOSPITAL_COMMUNITY)
Admission: EM | Admit: 2012-12-25 | Discharge: 2012-12-26 | Disposition: A | Discharge status: Other Acute Inpatient Hospital | Payer: No Typology Code available for payment source | Attending: Emergency Medicine | Admitting: Emergency Medicine

## 2012-12-25 DIAGNOSIS — Z8659 Personal history of other mental and behavioral disorders: Secondary | ICD-10-CM | POA: Insufficient documentation

## 2012-12-25 DIAGNOSIS — F29 Unspecified psychosis not due to a substance or known physiological condition: Secondary | ICD-10-CM | POA: Insufficient documentation

## 2012-12-25 DIAGNOSIS — R45851 Suicidal ideations: Secondary | ICD-10-CM | POA: Insufficient documentation

## 2012-12-25 DIAGNOSIS — F17200 Nicotine dependence, unspecified, uncomplicated: Secondary | ICD-10-CM | POA: Insufficient documentation

## 2012-12-25 DIAGNOSIS — F259 Schizoaffective disorder, unspecified: Secondary | ICD-10-CM | POA: Insufficient documentation

## 2012-12-25 DIAGNOSIS — Z8619 Personal history of other infectious and parasitic diseases: Secondary | ICD-10-CM | POA: Insufficient documentation

## 2012-12-25 DIAGNOSIS — F329 Major depressive disorder, single episode, unspecified: Secondary | ICD-10-CM | POA: Insufficient documentation

## 2012-12-25 DIAGNOSIS — F3289 Other specified depressive episodes: Secondary | ICD-10-CM | POA: Insufficient documentation

## 2012-12-25 DIAGNOSIS — F22 Delusional disorders: Secondary | ICD-10-CM | POA: Insufficient documentation

## 2012-12-25 DIAGNOSIS — R443 Hallucinations, unspecified: Secondary | ICD-10-CM | POA: Insufficient documentation

## 2012-12-25 DIAGNOSIS — F1021 Alcohol dependence, in remission: Secondary | ICD-10-CM | POA: Insufficient documentation

## 2012-12-25 HISTORY — DX: Bipolar disorder, unspecified: F31.9

## 2012-12-25 LAB — ETHANOL: Alcohol, Ethyl (B): <11 mg/dL (ref 0–11)

## 2012-12-25 LAB — COMPREHENSIVE METABOLIC PANEL
AST: 17 U/L (ref 0–37)
Albumin: 3.6 g/dL (ref 3.5–5.2)
BUN: 15 mg/dL (ref 6–23)
Creatinine, Ser: 1.01 mg/dL (ref 0.50–1.35)
Potassium: 3.6 mEq/L (ref 3.5–5.1)
Total Protein: 7.2 g/dL (ref 6.0–8.3)

## 2012-12-25 LAB — ACETAMINOPHEN LEVEL: Acetaminophen (Tylenol), Serum: <15 ug/mL (ref 10–30)

## 2012-12-25 LAB — CBC
HCT: 41.1 % (ref 39.0–52.0)
MCHC: 34.8 g/dL (ref 30.0–36.0)
MCV: 92.2 fL (ref 78.0–100.0)
Platelets: 347 10*3/uL (ref 150–400)
RDW: 13.4 % (ref 11.5–15.5)
WBC: 10.4 10*3/uL (ref 4.0–10.5)

## 2012-12-25 LAB — RAPID URINE DRUG SCREEN, HOSP PERFORMED
Amphetamines: NOT DETECTED
Barbiturates: NOT DETECTED
Benzodiazepines: POSITIVE — AB
Cocaine: NOT DETECTED

## 2012-12-25 LAB — SALICYLATE LEVEL: Salicylate Lvl: <2 mg/dL — ABNORMAL LOW (ref 2.8–20.0)

## 2012-12-25 MED ORDER — LORAZEPAM 1 MG PO TABS: 1.0000 mg | ORAL_TABLET | Freq: Three times a day (TID) | ORAL | Status: DC | PRN

## 2012-12-25 NOTE — ED Notes (Signed)
Pt transported from his home c/o suicidal ideation. Pt has been of medications for bipolar and schizoaffective disorder for months. Pt reports auditory and visual hallucinations

## 2012-12-25 NOTE — ED Provider Notes (Signed)
CSN: [id number]     Arrival date & time 12/25/12  2129 History     First MD Initiated Contact with Patient 12/25/12 2138     Chief Complaint  Patient presents with  . Medical Clearance   (Consider location/radiation/quality/duration/timing/severity/associated sxs/prior Treatment) The history is provided by the patient. The history is limited by the condition of the patient.  pt w hx depression and schizophrenia, c/o being off his meds for months, and states is hearing voices. Pt states doesn't know what they are saying. Is having paranoid thoughts. Also states feels depressed. Denies SI, or thoughts of harm to others. States he has a lot of stress including 'women problems'.  Pt very poor historian - level 5 caveat - psychiatric illness. Pt denies any recent physical health problems.      Past Medical History  Diagnosis Date  . Hepatitis C   . Depression   . Schizophrenia   . Depression   . Anxiety   . Alcohol abuse   . Bipolar 1 disorder    History reviewed. No pertinent past surgical history. No family history on file. History  Substance Use Topics  . Smoking status: Current Every Day Smoker -- 0.50 packs/day  . Smokeless tobacco: Not on file  . Alcohol Use: Yes    Review of Systems  Unable to perform ROS: Psychiatric disorder  Constitutional: Negative for fever.  Respiratory: Negative for cough and shortness of breath.   Cardiovascular: Negative for chest pain.  Gastrointestinal: Negative for abdominal pain.  Neurological: Negative for headaches.  Psychiatric/Behavioral: Positive for hallucinations and dysphoric mood.  level 5 caveat - psych illness    Allergies  Review of patient's allergies indicates no known allergies.  Home Medications  No current outpatient prescriptions on file. There were no vitals taken for this visit. Physical Exam  Nursing note and vitals reviewed. Constitutional: He appears well-developed and well-nourished. No distress.  HENT:   Head: Atraumatic.  Eyes: Conjunctivae are normal. Pupils are equal, round, and reactive to light. No scleral icterus.  Neck: Neck supple. No tracheal deviation present.  Cardiovascular: Normal rate, regular rhythm, normal heart sounds and intact distal pulses.   Pulmonary/Chest: Effort normal and breath sounds normal. No accessory muscle usage. No respiratory distress.  Abdominal: Soft. Bowel sounds are normal. He exhibits no distension and no mass. There is no tenderness. There is no rebound and no guarding.  Musculoskeletal: Normal range of motion. He exhibits no edema and no tenderness.  Neurological: He is alert.  Motor intact bil. Steady gait.   Skin: Skin is warm and dry. He is not diaphoretic.  Psychiatric:  Pt w disorganized thought process, paranoid ideation. Denies SI/HI.     ED Course   Procedures (including critical care time)  Results for orders placed during the hospital encounter of 12/25/12  ACETAMINOPHEN LEVEL      Result Value Range   Acetaminophen (Tylenol), Serum <15.0  10 - 30 ug/mL  CBC      Result Value Range   WBC 10.4  4.0 - 10.5 K/uL   RBC 4.46  4.22 - 5.81 MIL/uL   Hemoglobin 14.3  13.0 - 17.0 g/dL   HCT 95.6  21.3 - 08.6 %   MCV 92.2  78.0 - 100.0 fL   MCH 32.1  26.0 - 34.0 pg   MCHC 34.8  30.0 - 36.0 g/dL   RDW 57.8  46.9 - 62.9 %   Platelets 347  150 - 400 K/uL  COMPREHENSIVE METABOLIC PANEL  Result Value Range   Sodium 135  135 - 145 mEq/L   Potassium 3.6  3.5 - 5.1 mEq/L   Chloride 103  96 - 112 mEq/L   CO2 21  19 - 32 mEq/L   Glucose, Bld 89  70 - 99 mg/dL   BUN 15  6 - 23 mg/dL   Creatinine, Ser 0.45  0.50 - 1.35 mg/dL   Calcium 8.8  8.4 - 40.9 mg/dL   Total Protein 7.2  6.0 - 8.3 g/dL   Albumin 3.6  3.5 - 5.2 g/dL   AST 17  0 - 37 U/L   ALT 13  0 - 53 U/L   Alkaline Phosphatase 94  39 - 117 U/L   Total Bilirubin 0.3  0.3 - 1.2 mg/dL   GFR calc non Af Amer 85 (*) >90 mL/min   GFR calc Af Amer >90  >90 mL/min  ETHANOL       Result Value Range   Alcohol, Ethyl (B) <11  0 - 11 mg/dL  SALICYLATE LEVEL      Result Value Range   Salicylate Lvl <2.0 (*) 2.8 - 20.0 mg/dL  URINE RAPID DRUG SCREEN (HOSP PERFORMED)      Result Value Range   Opiates NONE DETECTED  NONE DETECTED   Cocaine NONE DETECTED  NONE DETECTED   Benzodiazepines POSITIVE (*) NONE DETECTED   Amphetamines NONE DETECTED  NONE DETECTED   Tetrahydrocannabinol NONE DETECTED  NONE DETECTED   Barbiturates NONE DETECTED  NONE DETECTED       MDM  Labs.  Reviewed nursing notes and prior charts for additional history.   Recheck pt content, alert ,awaiting psych team eval.  Have discussed pt with psych team/PA - assessment pending.  Will sign out to oncoming provider to follow up with psych team assessment and dispo appropriately.     Suzi Roots, MD 12/25/12 2252

## 2012-12-25 NOTE — ED Notes (Signed)
Pt aaox3.  Pt present to Psych ED room 42 from triage.  Pt reports "hearing voices talking about me and im seeing black spots on the wall"  Pt denies voices commanding.  Pt denies SI/HI.  Pt reports AH/VH

## 2012-12-26 ENCOUNTER — Encounter (HOSPITAL_COMMUNITY): Payer: Self-pay | Admitting: *Deleted

## 2012-12-26 ENCOUNTER — Inpatient Hospital Stay (HOSPITAL_COMMUNITY)
Admission: AD | Admit: 2012-12-26 | Discharge: 2013-01-01 | DRG: 885 | Disposition: A | Discharge status: Home / Self Care | Payer: No Typology Code available for payment source | Source: Intra-hospital | Attending: Psychiatry | Admitting: Psychiatry

## 2012-12-26 DIAGNOSIS — F200 Paranoid schizophrenia: Principal | ICD-10-CM | POA: Diagnosis present

## 2012-12-26 DIAGNOSIS — B1920 Unspecified viral hepatitis C without hepatic coma: Secondary | ICD-10-CM | POA: Diagnosis present

## 2012-12-26 DIAGNOSIS — F29 Unspecified psychosis not due to a substance or known physiological condition: Secondary | ICD-10-CM | POA: Diagnosis present

## 2012-12-26 DIAGNOSIS — F259 Schizoaffective disorder, unspecified: Secondary | ICD-10-CM

## 2012-12-26 DIAGNOSIS — Z79899 Other long term (current) drug therapy: Secondary | ICD-10-CM

## 2012-12-26 DIAGNOSIS — F209 Schizophrenia, unspecified: Secondary | ICD-10-CM | POA: Diagnosis present

## 2012-12-26 DIAGNOSIS — F411 Generalized anxiety disorder: Secondary | ICD-10-CM | POA: Diagnosis present

## 2012-12-26 DIAGNOSIS — F319 Bipolar disorder, unspecified: Secondary | ICD-10-CM | POA: Diagnosis present

## 2012-12-26 MED ORDER — NAPROXEN 500 MG PO TABS: 500.0000 mg | ORAL_TABLET | Freq: Two times a day (BID) | ORAL | Status: DC

## 2012-12-26 MED ORDER — ALUM & MAG HYDROXIDE-SIMETH 200-200-20 MG/5ML PO SUSP
30.0000 mL | ORAL | Status: DC | PRN
  Administered 2012-12-28 – 2012-12-30 (×2): 30 mL via ORAL

## 2012-12-26 MED ORDER — TRAZODONE HCL 50 MG PO TABS
50.0000 mg | ORAL_TABLET | Freq: Every evening | ORAL | Status: DC | PRN
  Administered 2012-12-26: 50 mg via ORAL
  Filled 2012-12-26: qty 1

## 2012-12-26 MED ORDER — IBUPROFEN 400 MG PO TABS
400.0000 mg | ORAL_TABLET | Freq: Three times a day (TID) | ORAL | Status: DC | PRN
  Administered 2012-12-26 – 2012-12-27 (×3): 400 mg via ORAL
  Filled 2012-12-26 (×4): qty 1

## 2012-12-26 MED ORDER — MAGNESIUM HYDROXIDE 400 MG/5ML PO SUSP: 30.0000 mL | Freq: Every day | ORAL | Status: DC | PRN

## 2012-12-26 MED ORDER — ACETAMINOPHEN 325 MG PO TABS
650.0000 mg | ORAL_TABLET | Freq: Four times a day (QID) | ORAL | Status: DC | PRN
  Administered 2012-12-26: 650 mg via ORAL

## 2012-12-26 MED ORDER — RISPERIDONE 1 MG PO TABS
1.0000 mg | ORAL_TABLET | Freq: Every day | ORAL | Status: DC
  Administered 2012-12-26 – 2012-12-28 (×3): 1 mg via ORAL
  Filled 2012-12-26 (×5): qty 1

## 2012-12-26 NOTE — BHH Counselor (Signed)
Writer consulted with the ER MD and the PA-C regarding the patient meeting criteria for inpatient hospitalization.  Patient has been accepted by Dr. Jannifer Franklin to bed 405-2 at [facility].  Writer informed the patient that he has been accepted to [facility].  Writer completed the support paperwork with the patient.  Writer has faxed the support paperwork to [facility]. Writer informed the [facility] that the support paperwork has been faxed.

## 2012-12-26 NOTE — BHH Group Notes (Signed)
B[facility] Group Notes:  (Clinical Social Work)  12/26/2012  11:15-11:45AM  Summary of Progress/Problems:   The main focus of today's process group was for the patient to identify ways in which they have in the past sabotaged their own recovery and reasons they may have done this/what they received from doing it.  We then worked to identify a specific plan to avoid doing this when discharged from the hospital for this admission.  The patient expressed that he self-sabotages through use of alcohol.  Type of Therapy:  Group Therapy - Process  Participation Level:  Minimal  Participation Quality:  Attentive  Affect:  Blunted  Cognitive:  Alert  Insight:  Developing/Improving  Engagement in Therapy:  Developing/Improving  Modes of Intervention:  Clarification, Education, Exploration, Discussion  Ambrose Mantle, LCSW 12/26/2012, 1:34 PM
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
## 2012-12-26 NOTE — Progress Notes (Signed)
The focus of this group is to help patients review their daily goal of treatment and discuss progress on daily workbooks. Pt attended the evening group session and responded to discussion prompts from the Writer. "My day was good because I'm alive! Isn't that enough reason for it to be a good day?" Pt's only additional need from Nursing Staff this evening was to be allowed to shave, which he was allowed to do after group. Pt shared that his long term goal was to be "on a rocket to the moon." When asked for clarification on this goal, Pt responded, "I guess what I'm saying is I want to be anywhere but nowhere." Pt's affect was neutral.

## 2012-12-26 NOTE — Progress Notes (Signed)
Adult Psychoeducational Group Note  Date:  12/26/2012 Time:  3:00PM Group Topic/Focus:  Healthy Coping Skills  Participation Level:  Did Not Attend   Additional Comments:  Pt. Didn't attend.   Bing Plume D 12/26/2012, 5:20 PM

## 2012-12-26 NOTE — Tx Team (Signed)
Initial Interdisciplinary Treatment Plan  PATIENT STRENGTHS: (choose at least two) General fund of knowledge Physical Health  PATIENT STRESSORS: Financial difficulties Health problems Legal issue Marital or family conflict Medication change or noncompliance   PROBLEM LIST: Problem List/Patient Goals Date to be addressed Date deferred Reason deferred Estimated date of resolution  A/V hallucinations 12/26/12     Suicidal ideation 12/26/12     Pain right knee 12/26/12                                          DISCHARGE CRITERIA:  Ability to meet basic life and health needs Improved stabilization in mood, thinking, and/or behavior Verbal commitment to aftercare and medication compliance  PRELIMINARY DISCHARGE PLAN: Outpatient therapy  PATIENT/FAMIILY INVOLVEMENT: This treatment plan has been presented to and reviewed with the patient, Alfred Jackson, Alfred Jackson 12/26/2012, 6:05 AM

## 2012-12-26 NOTE — Progress Notes (Signed)
D:  Per pt self inventory pt reports sleeping is poor, appetite is fair , energy level is fair, rates depression at a 6, c/o right leg pain at 10 and was given tylenol for it.Pt is vague regarding voices there always there, mood is irritable with blunted affect  A:  Support and encouragement provided, encouraged pt to attend all groups and activities, q15 minute checks continued for safety.  R:  Pt is non-compliant  with treatment found pt. with cigarettes in room. Admitted to smoking Dr. Theotis Barrio and supervisior made aware. Maintained on q 15 minute checks. Motrin given for right knee pain with no relieve no redness or swelling noted pt wanting and xray.
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
## 2012-12-26 NOTE — Consult Note (Signed)
[facility] Psychiatry Consult   Reason for Consult: ED referral Referring Physician:  ED Providers Alfred Jackson is an 52 y.o. male.  Assessment: AXIS I: Schizophrenic  Schizoaffective Disorder AXIS II:  Deferred AXIS III:   Past Medical History  Diagnosis Date  . Hepatitis C   . Depression   . Schizophrenia   . Depression   . Anxiety   . Alcohol abuse   . Bipolar 1 disorder    AXIS IV:  economic problems AXIS V:  21-30 behavior considerably influenced by delusions or hallucinations OR serious impairment in judgment, communication OR inability to function in almost all areas  Plan:  Recommend psychiatric Inpatient admission when medically cleared.  Subjective:   Alfred Jackson is a 52 y.o. male patient admitted with recurrence of psychosis off medication for his schizophrenic schizoaffective Disorder.  HPI: See ED provider note HPI Elements:   Severity:  acute psychosis with auditory hallucinosis.of meds "for months"  Past Psychiatric History: Past Medical History  Diagnosis Date  . Hepatitis C   . Depression   . Schizophrenia   . Depression   . Anxiety   . Alcohol abuse   . Bipolar 1 disorder     reports that he has been smoking.  He does not have any smokeless tobacco history on file. He reports that  drinks alcohol. He reports that he does not use illicit drugs. No family history on file.         Allergies:  No Known Allergies  Past Psychiatric History: schizoaffecytive schizophrenia/Bipolar (?)/ETOH abuse  Hospitalizations: None at [facility]  Outpatient Care:  None at [facility]   Substance Abuse Care:  Has been seen in ERs for alcohol problems in past  Self-Mutilation:  NA  Suicidal Attempts:  Unknown  Violent Behaviors:  None known   Objective: Blood pressure 117/72, pulse 80, temperature 97.5 F (36.4 C), temperature source Oral, resp. rate 18, SpO2 100.00%.There is no weight on file to calculate BMI. Results for orders placed during the hospital encounter of  12/25/12 (from the past 72 hour(s))  ACETAMINOPHEN LEVEL     Status: None   Collection Time    12/25/12  9:58 PM      Result Value Range   Acetaminophen (Tylenol), Serum <15.0  10 - 30 ug/mL   Comment:            THERAPEUTIC CONCENTRATIONS VARY     SIGNIFICANTLY. A RANGE OF 10-30     ug/mL MAY BE AN EFFECTIVE     CONCENTRATION FOR MANY PATIENTS.     HOWEVER, SOME ARE BEST TREATED     AT CONCENTRATIONS OUTSIDE THIS     RANGE.     ACETAMINOPHEN CONCENTRATIONS     >150 ug/mL AT 4 HOURS AFTER     INGESTION AND >50 ug/mL AT 12     HOURS AFTER INGESTION ARE     OFTEN ASSOCIATED WITH TOXIC     REACTIONS.  CBC     Status: None   Collection Time    12/25/12  9:58 PM      Result Value Range   WBC 10.4  4.0 - 10.5 K/uL   RBC 4.46  4.22 - 5.81 MIL/uL   Hemoglobin 14.3  13.0 - 17.0 g/dL   HCT 16.1  09.6 - 04.5 %   MCV 92.2  78.0 - 100.0 fL   MCH 32.1  26.0 - 34.0 pg   MCHC 34.8  30.0 - 36.0 g/dL   RDW 13.4  11.5 - 15.5 %   Platelets 347  150 - 400 K/uL  COMPREHENSIVE METABOLIC PANEL     Status: Abnormal   Collection Time    12/25/12  9:58 PM      Result Value Range   Sodium 135  135 - 145 mEq/L   Potassium 3.6  3.5 - 5.1 mEq/L   Chloride 103  96 - 112 mEq/L   CO2 21  19 - 32 mEq/L   Glucose, Bld 89  70 - 99 mg/dL   BUN 15  6 - 23 mg/dL   Creatinine, Ser 1.61  0.50 - 1.35 mg/dL   Calcium 8.8  8.4 - 09.6 mg/dL   Total Protein 7.2  6.0 - 8.3 g/dL   Albumin 3.6  3.5 - 5.2 g/dL   AST 17  0 - 37 U/L   ALT 13  0 - 53 U/L   Alkaline Phosphatase 94  39 - 117 U/L   Total Bilirubin 0.3  0.3 - 1.2 mg/dL   GFR calc non Af Amer 85 (*) >90 mL/min   GFR calc Af Amer >90  >90 mL/min   Comment: (NOTE)     The eGFR has been calculated using the CKD EPI equation.     This calculation has not been validated in all clinical situations.     eGFR's persistently <90 mL/min signify possible Chronic Kidney     Disease.  ETHANOL     Status: None   Collection Time    12/25/12  9:58 PM      Result  Value Range   Alcohol, Ethyl (B) <11  0 - 11 mg/dL   Comment:            LOWEST DETECTABLE LIMIT FOR     SERUM ALCOHOL IS 11 mg/dL     FOR MEDICAL PURPOSES ONLY  SALICYLATE LEVEL     Status: Abnormal   Collection Time    12/25/12  9:58 PM      Result Value Range   Salicylate Lvl <2.0 (*) 2.8 - 20.0 mg/dL  URINE RAPID DRUG SCREEN (HOSP PERFORMED)     Status: Abnormal   Collection Time    12/25/12 10:02 PM      Result Value Range   Opiates NONE DETECTED  NONE DETECTED   Cocaine NONE DETECTED  NONE DETECTED   Benzodiazepines POSITIVE (*) NONE DETECTED   Amphetamines NONE DETECTED  NONE DETECTED   Tetrahydrocannabinol NONE DETECTED  NONE DETECTED   Barbiturates NONE DETECTED  NONE DETECTED   Comment:            DRUG SCREEN FOR MEDICAL PURPOSES     ONLY.  IF CONFIRMATION IS NEEDED     FOR ANY PURPOSE, NOTIFY LAB     WITHIN 5 DAYS.                LOWEST DETECTABLE LIMITS     FOR URINE DRUG SCREEN     Drug Class       Cutoff (ng/mL)     Amphetamine      1000     Barbiturate      200     Benzodiazepine   200     Tricyclics       300     Opiates          300     Cocaine          300     THC  50   Labs are reviewed and are pertinent for no alcohol/cleared medically  Current Facility-Administered Medications  Medication Dose Route Frequency Provider Last Rate Last Dose  . LORazepam (ATIVAN) tablet 1 mg  1 mg Oral Q8H PRN Suzi Roots, MD       No current outpatient prescriptions on file.    Psychiatric Specialty Exam:     Blood pressure 117/72, pulse 80, temperature 97.5 F (36.4 C), temperature source Oral, resp. rate 18, SpO2 100.00%.There is no weight on file to calculate BMI.  General Appearance: weatherd appearance consistent with living outdoors  Eye Contact::  Poor  Speech:  Blocked  Volume:  Increased  Mood:  Anxious and Depressed  Affect:  Congruent  Thought Process:  Disorganized  Orientation:  NA  Thought Content:  Hallucinations: Auditory   Suicidal Thoughts:  No  Homicidal Thoughts:  No  Memory:  Negative  Judgement:  Poor  Insight:  Lacking  Psychomotor Activity:  Decreased  Concentration:  Poor  Recall:  NA  Akathisia:  NA  Handed:  Right  AIMS (if indicated):     Assets:  Desire for Improvement  Sleep:   poor   Treatment Plan Summary:Admit to [facility] Daily contact with patient to assess and evaluate symptoms and progress in treatment  Maryjean Morn E 12/26/2012 3:22 AM

## 2012-12-26 NOTE — BHH Group Notes (Signed)
B[facility] Group Notes:  (Nursing/MHT/Case Management/Adjunct)  Date:  12/26/2012  Time:  10:51 AM  Type of Therapy:  Psychoeducational Skills  Participation Level:  Minimal  Participation Quality:  Attentive and Redirectable  Affect:  Angry and Irritable  Cognitive:  Alert, Appropriate and Oriented  Insight:  Limited  Engagement in Group:  Limited, Monopolizing and Resistant  Modes of Intervention:  Discussion and Education  Summary of Progress/Problems:developing healthy coping skills and inventory reviewed  Jimmey Ralph 12/26/2012, 10:51 AM

## 2012-12-27 MED ORDER — TRAZODONE HCL 100 MG PO TABS
100.0000 mg | ORAL_TABLET | Freq: Every evening | ORAL | Status: DC | PRN
  Administered 2012-12-29: 100 mg via ORAL
  Filled 2012-12-27: qty 1
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
## 2012-12-27 NOTE — [facility] Group Notes (Signed)
[facility] Group Notes:  (Clinical Social Work)  12/27/2012   11:15am-12:00pm  Summary of Progress/Problems:  The main focus of today's process group was to listen to a variety of genres of music and to identify that different types of music provoke different responses.  The patient then was able to identify personally what was soothing for them, as well as energizing.  Handouts were used to record feelings evoked, as well as how patient can personally use this knowledge in sleep habits, with depression, and with other symptoms.  The patient expressed understanding of concepts, as well as knowledge of how each type of music affected them and how this can be used when they are at home as a tool in their recovery.  He enjoyed this group greatly, joked about opera making him "suicidal" but that he better not say that because it could impact him leaving.  Type of Therapy:  Music Therapy   Participation Level:  Active  Participation Quality:  Attentive and Sharing  Affect:  Blunted  Cognitive:  Oriented  Insight:  Engaged  Engagement in Therapy:  Engaged  Modes of Intervention:   Activity, Exploration  Ambrose Mantle, LCSW 12/27/2012, 2:06 PM

## 2012-12-27 NOTE — Progress Notes (Signed)
Adult Psychoeducational Group Note  Date:  12/27/2012 Time:  8:00PM Group Topic/Focus:  Wrap-Up Group:   The focus of this group is to help patients review their daily goal of treatment and discuss progress on daily workbooks.  Participation Level:  Active  Participation Quality:  Appropriate and Attentive  Affect:  Appropriate  Cognitive:  Alert and Appropriate  Insight: Appropriate  Engagement in Group:  Engaged  Modes of Intervention:  Discussion  Additional Comments:  Pt. Was attentive and appropriate during tonight's group discussion. Pt stated that he had a overall good day.   Bing Plume D 12/27/2012, 9:55 PM

## 2012-12-27 NOTE — Progress Notes (Signed)
Patient ID: Alfred Jackson, male   DOB: 1961-01-29, 52 y.o.   MRN: [id number]  D: Patient pleasant on approach tonight. Patient talking about wanting discharge soon. Stated he come here to get back on medication. Patient making some bizarre statements at times and does report hearing voices at times. Started on Risperdal tonight. A: Staff will monitor on q 15 minute checks, follow treatment plan, and give meds as ordered. R: Cooperative on unit.

## 2012-12-27 NOTE — Progress Notes (Signed)
Patient ID: Alfred Jackson, male   DOB: 1961/04/04, 52 y.o.   MRN: [id number]    S;   Reports conflict with wife now. Continues to have pain in right leg. Poor sleep last night. Feels depressed right now.     General Appearance: Disheveled   Eye Contact:: Poor   Speech: Slow   Volume: Decreased   Mood: Irritable   Affect:  Restricted   Thought Process: Disorganized   Orientation: Full (Time, Place, and Person)   Thought Content: Paranoid Ideation   Suicidal Thoughts: No   Homicidal Thoughts: No   Memory: Immediate; Poor   Judgement: Poor   Insight: Lacking   Psychomotor Activity: Decreased   Concentration: Poor   Recall: Poor   Akathisia: Negative   Handed: Right   AIMS (if indicated):   Assets: Resilience   Sleep:   Past Psychiatric History:  Diagnosis:   Hospitalizations:   Outpatient Care:   Substance Abuse Care:   Self-Mutilation:   Suicidal Attempts:   Violent Behaviors:        Psychological Evaluations:  Assessment:  AXIS I: Psychotic d/o nos  AXIS II: Deferred  AXIS III:  Past Medical History   Diagnosis  Date   .  Hepatitis C    .  Depression    .  Schizophrenia    .  Depression    .  Anxiety    .  Alcohol abuse    .  Bipolar 1 disorder     AXIS IV: other psychosocial or environmental problems  AXIS V: 21-30 behavior considerably influenced by delusions or hallucinations OR serious impairment in judgment, communication OR inability to function in almost all areas  Treatment Plan/Recommendations:   Continue risperidone for psychosis Will consider SSRI for mood later Increase trazodone to 100 mg qhs for sleep

## 2012-12-27 NOTE — Progress Notes (Signed)
Patient ID: Alfred Jackson, male   DOB: 12/26/1960, 52 y.o.   MRN: [id number]  D: Patient pleasant and cooperative with staff, and interacted well with peers on unit. A: Monitor Q 15 minutes, encourage group participation and staff/peer interaction. Administer medications as ordered by MD. R: Patient compliant with medications, pt brightens on approach. Pt participated in group session, denies SI/HI.

## 2012-12-27 NOTE — Progress Notes (Signed)
Patient ID: Alfred Jackson, male   DOB: 11-20-1960, 52 y.o.   MRN: [id number] 12-27-12 nursing shift note: D: pt has been participating in the milieu, taking his medications and going to groups. He has had no adverse effects from his medications. He stated he has chronic right knee pain. Continues to have some auditory hallucination and denied any SI. A: staff continues to support, encourage and redirect when necessary. Administered ibuprofen to address his R knee pain. He was also advised to ask the MD for something stronger to alleviate his knee pain. R: knee pain remained the same and an additional order was not written. On his inventory sheet he wrote slept well, appetite good, energy normal, attention improving with his depression and hopelessness at 3 and 5 respectively. After discharge he plans to "find a new girlfriend. RN will monitor and Q 15 min ck's continue.

## 2012-12-27 NOTE — Progress Notes (Signed)
Patient ID: ALONZA KNISLEY, male   DOB: [birth year], 52 y.o.   MRN: [id number] Psychoeducational Group Note  Date:  12/27/2012 Time:  1000am  Group Topic/Focus:  Making Healthy Choices:   The focus of this group is to help patients identify negative/unhealthy choices they were using prior to admission and identify positive/healthier coping strategies to replace them upon discharge.  Participation Level:  Active  Participation Quality:  Appropriate  Affect:  Appropriate  Cognitive:  Appropriate  Insight:  Supportive  Engagement in Group:  Supportive  Additional Comments:  Inventory and Psychoeducational group   Valente David 12/27/2012,10:34 AM

## 2012-12-28 DIAGNOSIS — F200 Paranoid schizophrenia: Principal | ICD-10-CM

## 2012-12-28 DIAGNOSIS — F29 Unspecified psychosis not due to a substance or known physiological condition: Secondary | ICD-10-CM | POA: Diagnosis present

## 2012-12-28 DIAGNOSIS — F209 Schizophrenia, unspecified: Secondary | ICD-10-CM | POA: Diagnosis present

## 2012-12-28 DIAGNOSIS — F411 Generalized anxiety disorder: Secondary | ICD-10-CM

## 2012-12-28 NOTE — Progress Notes (Signed)
The focus of this group is to help patients review their daily goal of treatment and discuss progress on daily workbooks. Pt attended the evening group session and responded to all discussion prompts from the Writer. Pt reported having a good day on the unit, but was unable to specify any particular reason why. Pt shared that he had been hitting "potholes" in his life for twenty years and looked forward upon discharge to "no more potholes." Pt's affect was appropriate.

## 2012-12-28 NOTE — Tx Team (Signed)
Interdisciplinary Treatment Plan Update (Adult)  Date: 12/28/2012  Time Reviewed:  9:45 AM  Progress in Treatment: Attending groups: Yes Participating in groups:  Yes Taking medication as prescribed:  Yes Tolerating medication:  Yes Family/Significant othe contact made: CSW assessing  Patient understands diagnosis:  Yes Discussing patient identified problems/goals with staff:  Yes Medical problems stabilized or resolved:  Yes Denies suicidal/homicidal ideation: Yes Issues/concerns per patient self-inventory:  Yes Other:  New problem(s) identified: N/A  Discharge Plan or Barriers: CSW assessing for appropriate referrals.  Reason for Continuation of Hospitalization: Anxiety Depression Medication Stabilization  Comments: N/A  Estimated length of stay: 3-5 days  For review of initial/current patient goals, please see plan of care.  Attendees: Patient:     Family:     Physician:  Dr. Thedore Mins 12/28/2012 10:00 am  Nursing:   Joslyn Devon, RN 12/28/2012 10:00 am  Clinical Social Worker:  Reyes Ivan, LCSWA 12/28/2012 10:00 am  Other:    Other:     Other:     Other:     Other:    Other:    Other:    Other:    Other:    Other:     Scribe for Treatment Team:   Carmina Miller, 12/28/2012 12:44 PM

## 2012-12-28 NOTE — Progress Notes (Signed)
Nutrition Brief Note Patient identified on the Malnutrition Screening Tool (MST) Report.  Wt Readings from Last 10 Encounters:  12/26/12 171 lb (77.565 kg)  08/03/12 160 lb (72.576 kg)  05/21/11 180 lb (81.647 kg)   Body mass index is 23.19 kg/(m^2). Patient meets criteria for normal body weight based on current BMI.   Discussed intake PTA with patient and compared to intake presently.  Discussed changes in intake, if any, and encouraged adequate intake of meals and snacks. Current diet order is regular and pt is also offered choice of unit snacks mid-morning and mid-afternoon.  Pt is eating as desired.   Labs and medications reviewed.   Pt reported that he has been eating well since admission and that he does not need any supplements. He says that the food here has caused him to have excess amounts of gas.   Nutrition Dx:  Unintended wt change r/t suboptimal oral intake AEB pt report  Interventions:   Discussed the importance of nutrition and encouraged intake of food and beverages.     Discussed weight goals with patient.   Supplements: none    No additional nutrition interventions warranted at this time. If nutrition issues arise, please consult RD.   Ebbie Latus RD, LDN
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
## 2012-12-28 NOTE — [facility] Group Notes (Signed)
[facility] LCSW Group Therapy  12/28/2012  1:15 PM   Type of Therapy:  Group Therapy  Participation Level:  Active  Participation Quality:  Appropriate and Attentive  Affect:  Appropriate and Flat  Cognitive:  Alert and Appropriate  Insight:  Developing/Improving and Engaged  Engagement in Therapy:  Developing/Improving and Engaged  Modes of Intervention:  Clarification, Confrontation, Discussion, Education, Exploration, Limit-setting, Orientation, Problem-solving, Rapport Building, Dance movement psychotherapist, Socialization and Support  Summary of Progress/Problems: Pt identified obstacles faced currently and processed barriers involved in overcoming these obstacles. Pt identified steps necessary for overcoming these obstacles and explored motivation (internal and external) for facing these difficulties head on. Pt further identified one area of concern in their lives and chose a goal to focus on for today.  Pt shared that his biggest obstacle is depression and feels stuck now, both physically in here and mentally, to overcome the depression.  Pt actively participated in group discussion but had to be redirected multiple times for getting off topic.  Pt actively participated and was engaged in group discussion.    Reyes Ivan, LCSWA 12/28/2012 2:18 PM

## 2012-12-28 NOTE — Consult Note (Signed)
Agree with plan 
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
## 2012-12-28 NOTE — Progress Notes (Addendum)
Recreation Therapy Notes  Date: 08.18.2014 Time: 9:30am Location: [address] Dayroom  Group Topic: Decision Making  Goal Area(s) Addresses:  Patient will verbalize benefit of using good decision making skills. Patient will verbalize way to encourage good decision making in personal life.  Behavioral Response: Engaged, Appropriate   Intervention: Questions & Answers  Activity: Choices In a Jar. Patients were asked to select a card from the Choices in a Jar jar and select one option on the card. Each card contains an either or question used to encourage decision making skills.    Education: Discharge Planning, Personal Organization  Education Outcome: Acknowledges understanding  Clinical Observations/Feedback: Patient actively participated in group session. Patient contributed to opening discussion making connection between deciding to make time for family to building support system. Patient additionally stated how important having a good support system is. Patient actively participated in activity, selecting a card, making a choice and giving valid reasoning behind choice made. Patient made no contributions to wrap up discussion, but appeared to actively listen to LRT and peer statements as she maintained appropriate eye contact with speaker.   Marykay Lex Neha Waight, LRT/CTRS  Baelynn Schmuhl L 12/28/2012 1:25 PM

## 2012-12-29 MED ORDER — TRAZODONE HCL 50 MG PO TABS
50.0000 mg | ORAL_TABLET | Freq: Every evening | ORAL | Status: DC | PRN
  Filled 2012-12-29: qty 6

## 2012-12-29 MED ORDER — RISPERIDONE 2 MG PO TABS
2.0000 mg | ORAL_TABLET | Freq: Every day | ORAL | Status: DC
  Administered 2012-12-29 – 2012-12-31 (×3): 2 mg via ORAL
  Filled 2012-12-29 (×4): qty 1
  Filled 2012-12-29: qty 3

## 2012-12-29 NOTE — Progress Notes (Signed)
Patient ID: Alfred Jackson, male   DOB: 09-17-1960, 52 y.o.   MRN: [id number] [facility] MD Progress Note  12/29/2012 12:04 PM Alfred Jackson  MRN:  [id number] Subjective:   Patient states "I slept good. I'm just here because my girlfriend called 911 on me. I don't know really why I'm here. 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 The patient is observed laughing during conversation with Clinical research associate.   Objective: Patient remains with disorganized thought processes, inappropriate laughter, and bizarre affect.  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 Hepatitis C   . Depression   . Schizophrenia   . Depression   . Anxiety   . Alcohol abuse   . Bipolar 1 disorder    Axis IV: other psychosocial or environmental problems, problems related to social environment and problems with primary support group Axis V: 31-40 impairment in reality testing  ADL's:  Intact  Sleep: Poor  Appetite:  Fair  Suicidal Ideation:  Denies Homicidal Ideation:  Denies  Psychiatric Specialty Exam: Review of Systems  Constitutional: Negative.   HENT: Negative.   Eyes: Negative.   Respiratory: Negative.   Cardiovascular: Negative.   Gastrointestinal: Negative.   Genitourinary: Negative.   Musculoskeletal: Negative.   Skin: Negative.   Neurological: Negative.   Endo/Heme/Allergies: Negative.   Psychiatric/Behavioral: Positive for depression and hallucinations. Negative for suicidal ideas, memory loss and substance abuse. The patient is nervous/anxious. The patient does not have insomnia.     Blood pressure 153/91, pulse 105, temperature 98 F (36.7 C), temperature source Oral, resp. rate 19, height 6' (1.829 m), weight 77.565 kg (171  lb).Body mass index is 23.19 kg/(m^2).  General Appearance: Casual  Eye Contact::  Fair  Speech:  Normal Rate  Volume:  Normal  Mood:  Anxious  Affect:  Flat  Thought Process:  Disorganized  Orientation:  Full (Time, Place, and Person)  Thought Content:  Delusions and Paranoid Ideation, visual hallucinations  Suicidal Thoughts:  No  Homicidal Thoughts:  No  Memory:  Immediate;   Fair Recent;   Fair Remote;   Fair  Judgement:  Impaired  Insight:  Lacking  Psychomotor Activity:  Decreased  Concentration:  Fair  Recall:  Fair  Akathisia:  No  Handed:  Right  AIMS (if indicated):     Assets:  Desire for Improvement Resilience Social Support  Sleep:  Number of Hours: 5.75   Current Medications: Current Facility-Administered Medications  Medication Dose Route Frequency Provider Last Rate Last Dose  . acetaminophen (TYLENOL) tablet 650 mg  650 mg Oral Q6H PRN Court Joy, PA-C   650 mg at 12/26/12 1059  . alum & mag hydroxide-simeth (MAALOX/MYLANTA) 200-200-20 MG/5ML suspension 30 mL  30 mL Oral Q4H PRN Court Joy, PA-C   30 mL at 12/28/12 1656  . ibuprofen (ADVIL,MOTRIN) tablet 400 mg  400 mg Oral Q8H PRN Wonda Cerise, MD   400 mg at 12/27/12 1713  . magnesium hydroxide (MILK OF MAGNESIA) suspension 30 mL  30 mL Oral Daily PRN Court Joy, PA-C      . risperiDONE (RISPERDAL) tablet 2 mg  2 mg Oral QHS Mojeed Akintayo      . traZODone (DESYREL) tablet  50 mg  50 mg Oral QHS PRN,MR X 1 Mojeed Akintayo        Lab Results: No results found for this or any previous visit (from the past 48 hour(s)).  Physical Findings: AIMS: Facial and Oral Movements Muscles of Facial Expression: None, normal Lips and Perioral Area: None, normal Jaw: None, normal Tongue: None, normal,Extremity Movements Upper (arms, wrists, hands, fingers): None, normal Lower (legs, knees, ankles, toes): None, normal, Trunk Movements Neck, shoulders, hips: None, normal, Overall Severity Severity of  abnormal movements (highest score from questions above): None, normal Incapacitation due to abnormal movements: None, normal Patient's awareness of abnormal movements (rate only patient's report): No Awareness, Dental Status Current problems with teeth and/or dentures?: No Does patient usually wear dentures?: No  CIWA:    COWS:     Treatment Plan Summary: Daily contact with patient to assess and evaluate symptoms and progress in treatment Medication management  Plan:  Review of chart, vital signs, medications, and notes. 1-Individual and group therapy 2-Medication management for psychosis/delusions, and anxiety:  Medications reviewed with the patient and Trazodone discontinued and replaced with Rozerem for sleep issues. Increase Risperdal to 2 mg for continued delusions and psychosis.  3-Coping skills for anxiety, delusions, psychosis 4-Continue crisis stabilization and management 5-Address health issues--monitoring vital signs, stable 6-Treatment plan in progress to prevent relapse of psychosis/delusions, and anxiety  Medical Decision Making Problem Points:  Established problem, stable/improving (1) and Review of psycho-social stressors (1) Data Points:  Review of new medications or change in dosage (2)  I certify that inpatient services furnished can reasonably be expected to improve the patient's condition.   Fransisca Kaufmann, NP-C 12/29/2012, 12:04 PM

## 2012-12-29 NOTE — [facility] Group Notes (Signed)
Adult Psychoeducational Group Note  Date:  12/29/2012 Time:  9:49 PM  Group Topic/Focus:  Wrap-Up Group:   The focus of this group is to help patients review their daily goal of treatment and discuss progress on daily workbooks.  Participation Level:  Minimal  Participation Quality:  Appropriate  Affect:  Appropriate  Cognitive:  Appropriate  Insight: Appropriate  Engagement in Group:  Limited  Modes of Intervention:  Discussion  Additional Comments:  Alfred Jackson expressed that he had a good day, he joked around with the MHTs and had a visit for the first time since he has been here.  He also said he may be going home tomorrow.  Caroll Rancher A 12/29/2012, 9:49 PM

## 2012-12-29 NOTE — [facility] Group Notes (Signed)
[facility] LCSW Group Therapy  12/29/2012  1:15 PM   Type of Therapy:  Group Therapy  Participation Level:  Active  Participation Quality:  Appropriate and Attentive  Affect:  Appropriate and Bright  Cognitive:  Alert and Appropriate  Insight:  Developing/Improving and Engaged  Engagement in Therapy:  Developing/Improving and Engaged  Modes of Intervention:  Clarification, Confrontation, Discussion, Education, Exploration, Limit-setting, Orientation, Problem-solving, Rapport Building, Dance movement psychotherapist, Socialization and Support  Summary of Progress/Problems: The topic for group therapy was feelings about diagnosis.  Pt actively participated in group discussion on their past and current diagnosis and how they feel towards this.  Pt also identified how society and family members judge them, based on their diagnosis as well as stereotypes and stigmas.   Pt shared that having depression makes him feel worthless and down at times, but feels hopeful today that things will get better.  Pt often makes jokes and appears in a good mood today.  Pt actively participated and was engaged in group discussion.    Alfred Jackson, LCSWA 12/29/2012 2:10 PM

## 2012-12-29 NOTE — Progress Notes (Signed)
D: Pt denies SI/HI/AVH. Pt presents with disorganized thoughts, childlike behaviors, rapid pressured speech and inappropriate laughing. Pt continues to need redirecting by staff for inappropriate behaviors and playing around. Pt has poor insight. A: Medications administered as ordered per MD. Verbal support given. Pt encouraged to attend groups. 15 minute checks performed for safety. R: Pt receptive to treatment. Per pt, nothing is wrong with him, he do not need to be here. Pt continues to blame his girlfriend for making him come here.

## 2012-12-29 NOTE — Progress Notes (Signed)
D: Pt is a pleasant and cooperative 52 yr old male. Pt is currently denying any SI/HI/AVH. Pt was informed of qhs meds. Pt concerned about having a good night rest. Pt informed of his option to receive trazodone for sleep. Pt observed interacting appropriately within the milieu.  A: Writer administered scheduled and prn medications (trazodone) to pt. Continued support and availability as needed was extended to this pt. Staff continue to monitor pt with q60min checks.  R: No adverse drug reactions noted. Pt receptive to treatment. Pt remains safe at this time.

## 2012-12-30 NOTE — Progress Notes (Signed)
Patient ID: Alfred Jackson, male   DOB: 01/22/1961, 51 y.o.   MRN: [id number]  [facility] MD Progress Note  12/30/2012 11:35 AM ADRIC WREDE  MRN:  [id number] Subjective: " I am doing much better on my medication now." Objective: Patient reports that he has been sleeping better, feeling less paranoid or delusional. His thought process is more organized. He is compliant with his medications and has not verbalized any adverse reactions.  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 Hepatitis C   . Depression   . Schizophrenia   . Depression   . Anxiety   . Alcohol abuse   . Bipolar 1 disorder    Axis IV: other psychosocial or environmental problems, problems related to social environment and problems with primary support group Axis V: 40-50  ADL's:  Intact  Sleep: fair  Appetite:  Fair  Suicidal Ideation:  Denies Homicidal Ideation:  Denies  Psychiatric Specialty Exam: Review of Systems  Constitutional: Negative.   HENT: Negative.   Eyes: Negative.   Respiratory: Negative.   Cardiovascular: Negative.   Gastrointestinal: Negative.   Genitourinary: Negative.   Musculoskeletal: Negative.   Skin: Negative.   Neurological: Negative.   Endo/Heme/Allergies: Negative.   Psychiatric/Behavioral: Positive for depression and hallucinations. Negative for suicidal ideas, memory loss and substance abuse. The patient is nervous/anxious. The patient does not have insomnia.     Blood pressure 117/82, pulse 108, temperature 97.5 F (36.4 C), temperature source Oral, resp. rate 18, height 6' (1.829 m), weight 77.565 kg (171 lb).Body mass index is 23.19 kg/(m^2).  General Appearance: Casual  Eye Contact::  Fair  Speech:  Normal Rate  Volume:  Normal  Mood:  Anxious  Affect:  Flat  Thought Process:  Disorganized  Orientation:  Full (Time, Place, and Person)  Thought Content:  Delusions  and Paranoid Ideation, visual hallucinations  Suicidal Thoughts:  No  Homicidal Thoughts:  No  Memory:  Immediate;   Fair Recent;   Fair Remote;   Fair  Judgement:  Impaired  Insight:  Lacking  Psychomotor Activity:  Decreased  Concentration:  Fair  Recall:  Fair  Akathisia:  No  Handed:  Right  AIMS (if indicated):     Assets:  Desire for Improvement Resilience Social Support  Sleep:  Number of Hours: 5.25   Current Medications: Current Facility-Administered Medications  Medication Dose Route Frequency Provider Last Rate Last Dose  . acetaminophen (TYLENOL) tablet 650 mg  650 mg Oral Q6H PRN Court Joy, PA-C   650 mg at 12/26/12 1059  . alum & mag hydroxide-simeth (MAALOX/MYLANTA) 200-200-20 MG/5ML suspension 30 mL  30 mL Oral Q4H PRN Court Joy, PA-C   30 mL at 12/28/12 1656  . ibuprofen (ADVIL,MOTRIN) tablet 400 mg  400 mg Oral Q8H PRN Wonda Cerise, MD   400 mg at 12/27/12 1713  . magnesium hydroxide (MILK OF MAGNESIA) suspension 30 mL  30 mL Oral Daily PRN Court Joy, PA-C      . risperiDONE (RISPERDAL) tablet 2 mg  2 mg Oral QHS Chrystina Naff   2 mg at 12/29/12 2218  . traZODone (DESYREL) tablet 50 mg  50 mg Oral QHS PRN,MR X 1 Virl Coble        Lab Results: No results found for this or any previous visit (from the past 48 hour(s)).  Physical Findings: AIMS: Facial and Oral Movements  Muscles of Facial Expression: None, normal Lips and Perioral Area: None, normal Jaw: None, normal Tongue: None, normal,Extremity Movements Upper (arms, wrists, hands, fingers): None, normal Lower (legs, knees, ankles, toes): None, normal, Trunk Movements Neck, shoulders, hips: None, normal, Overall Severity Severity of abnormal movements (highest score from questions above): None, normal Incapacitation due to abnormal movements: None, normal Patient's awareness of abnormal movements (rate only patient's report): No Awareness, Dental Status Current problems with teeth  and/or dentures?: No Does patient usually wear dentures?: No  CIWA:    COWS:     Treatment Plan Summary: Daily contact with patient to assess and evaluate symptoms and progress in treatment Medication management  Plan:  Review of chart, vital signs, medications, and notes. 1-Individual and group therapy 2-Medication management for psychosis/delusions, and anxiety:   for sleep issues. Increase Risperdal to 2 mg for continued delusions and psychosis.  3-Coping skills for anxiety, delusions, psychosis 4-Continue crisis stabilization and management 5-Address health issues--monitoring vital signs, stable 6-Treatment plan in progress to prevent relapse of psychosis/delusions, and anxiety 7- Continue current medication regimen  Medical Decision Making Problem Points:  Established problem, stable/improving (1) and Review of psycho-social stressors (1) Data Points:  Review of new medications or change in dosage (2)  I certify that inpatient services furnished can reasonably be expected to improve the patient's condition.   Thedore Mins, MD 12/30/2012, 11:35 AM

## 2012-12-30 NOTE — [facility] Group Notes (Signed)
[facility] [employer] Group Therapy  12/30/2012 , 2:17 PM    Type of Therapy:  [employer] Presentation  Participation Level:  Active  Participation Quality:  Attentive  Affect:  Blunted  Cognitive:  Oriented  Insight:  Limited  Engagement in Therapy:  Engaged  Modes of Intervention:  Discussion, Education and Socialization  Summary of Progress/Problems:  Alfred Jackson from [employer] came to present his recovery story and play the guitar.  Sat quietly throughout.  Expressed his appreciation for the speaker coming in and playing.  Alfred Jackson 12/30/2012 , 2:17 PM

## 2012-12-30 NOTE — Progress Notes (Signed)
The focus of this group is to help patients review their daily goal of treatment and discuss progress on daily workbooks. Pt attended the evening group session and responded to all discussion prompts from the Writer. Pt reported having a good day on the unit, largely because he was able to go to the gym and play basketball/cornhole with his peers. Pt shared that he had no additional needs from Nursing Staff this evening when prompted. Pt's affect was bright and he was observed smiling throughout the group.

## 2012-12-30 NOTE — Progress Notes (Signed)
D: Pt denies SI/HI/AVH. Pt presents with disorganized thoughts. Rapid speech. Inappropriate laughing. Childlike behaviors. Silly. Pt has poor insight. Pt verbalized no complaints this morning. A: Medications administered as ordered per MD. Verbal support given. Pt encouraged to attend groups. 15 minute checks performed for safety. R: Pt pleasant and cooperative. Pt remains safe at this time.

## 2012-12-30 NOTE — Progress Notes (Signed)
Recreation Therapy Notes  Date: [phone] Time: 9:30am Location: [address] Dayroom  Group Topic: Leisure Education  Goal Area(s) Addresses:  Patient will verbalize understanding of benefits of leisure. Patient will identify positive activities that can be used during leisure/recreation time.   Behavioral Response: Engaged, Attentive, Appropriate  Intervention: Adapted Game  Activity: Leisure ABC's. LRT wrote alphabet on the white board in the day room. Patients were asked to identify activities that corresponded with letters of the alphabet.   Education:  Pharmacologist, Discharge Planning  Education Outcome: Acknowledges understanding  Clinical Observations/Feedback: Patient actively participated in opening discussion, stating a benefit of leisure/recreation is the ability to relax. Patient stated appropriate activity to correspond with letters of the alphabet. Patient contributed to wrap up discussion stating that if you participate in leisure activities with family and friends you will want to do it again.   Marykay Lex [name], LRT/CTRS  Jearl Klinefelter 12/30/2012 4:13 PM

## 2012-12-30 NOTE — Progress Notes (Signed)
Patient ID: Alfred Jackson, male   DOB: 1960-06-03, 52 y.o.   MRN: [id number] D: pt. In dayroom interacting, reports day been "pretty good," denies harmful thoughts. Pt. Reports he's here because "me and my GF was arguing, she dialed 911, I won't even drinking, she was the one that was drinking" "I ain't going back into that mess, I told her if it's gonna be like that I'm planning to leave." A: Writer introduced self to client and provide emotional support, listening. Writer reviewed medication admin times and encouraged group. Staff will monitor q44min for safety. R: Pt. Is safe on the unit and attended group.

## 2012-12-30 NOTE — Progress Notes (Signed)
Seen and agreed. Jann Ra, MD 

## 2012-12-30 NOTE — [facility] Group Notes (Signed)
[facility] LCSW Aftercare Discharge Planning Group Note   12/30/2012 2:11 PM  Participation Quality:  Engaged  Mood/Affect:  Appropriate  Depression Rating:    Anxiety Rating:    Thoughts of Suicide:  No Will you contract for safety?   NA  Current AVH:  No  Plan for Discharge/Comments:  Alfred Jackson states he thinks he is ready to go.  Unsure what medication he is taking.  Told him he would not be d/ced until he knows.  Went out immediately after group to find out from his nurse what he is being prescribed, and then reported to me.  States the woman he has been in a relationship for 22 years was drinking and told someone he was suicidal.  According to staff, his thinking ismuch more organized today.  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  Transportation Means: family  Supports:  family  [place], Alfred Jackson

## 2012-12-30 NOTE — Progress Notes (Signed)
Patient ID: STARSKY NANNA, male   DOB: 1960/06/17, 52 y.o.   MRN: [id number]  D: Pt denies SI/HI/AVH/pain. Pt is pleasant and cooperative. 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   A: Pt was offered support and encouragement. Pt was given scheduled medications. Pt was encourage to attend groups. Q 15 minute checks were done for safety.   R:Pt attends groups and interacts well with peers and staff. Pt is taking medication. Pt has no complaints at this time.Pt receptive to treatment and safety maintained on unit.

## 2012-12-31 NOTE — Progress Notes (Signed)
D: Pt states He slept well last night, appetite is good, energy and focus has improved. Pt rates his depression at a 3 and hopelessness a 4, with 10 being the worst feeling in both thoughts. Pt denies SI. Pt going to meals and groups, smiling and interacting with staff and peers. A: Pt encouraged to think about goals for aftercare. R: Pt states he feels better, anxious to go home, denies A/V hallucinations, denies SI/HI.

## 2012-12-31 NOTE — [facility] Group Notes (Signed)
[facility] Group Notes:  (Counselor/Nursing/MHT/Case Management/Adjunct)  12/31/2012 1:15PM  Type of Therapy:  Group Therapy  Participation Level:  Active  Participation Quality:  Appropriate  Affect:  Flat  Cognitive:  Oriented  Insight:  Improving  Engagement in Group:  Limited  Engagement in Therapy:  Limited  Modes of Intervention:  Discussion, Exploration and Socialization  Summary of Progress/Problems: The topic for group was balance in life.  Pt participated in the discussion about when their life was in balance and out of balance and how this feels.  Pt discussed ways to get back in balance and short term goals they can work on to get where they want to be.  Alfred Jackson states he is unbalanced, and explained that he has some physical problems that make him discouraged sometimes.  He interjected multiple times throughout the group, but most of his statements were designed to try to make others laugh-most of which did not go over well.   [name] 12/31/2012 3:56 PM

## 2012-12-31 NOTE — Progress Notes (Signed)
Patient ID: Alfred Jackson, male   DOB: 06-08-1960, 52 y.o.   MRN: [id number]  [facility] MD Progress Note  12/31/2012 12:38 PM Alfred Jackson  MRN:  [id number] Subjective: "I am doing better and feel ready to go home." Objective:  Patient observed interacting with peers in the dayroom today. He continues to laugh during conversation inappropriately but denies that he is hearing any voices. The patient minimizes his symptoms of mental illness denying that there was any reason for him to be here.   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 Hepatitis C   . Depression   . Schizophrenia   . Depression   . Anxiety   . Alcohol abuse   . Bipolar 1 disorder    Axis IV: other psychosocial or environmental problems, problems related to social environment and problems with primary support group Axis V: 60-70 ADL's:  Intact  Sleep:  Good  Appetite:  Good  Suicidal Ideation:  Denies Homicidal Ideation:  Denies  Psychiatric Specialty Exam: Review of Systems  Constitutional: Negative.   HENT: Negative.   Eyes: Negative.   Respiratory: Negative.   Cardiovascular: Negative.   Gastrointestinal: Negative.   Genitourinary: Negative.   Musculoskeletal: Negative.   Skin: Negative.   Neurological: Negative.   Endo/Heme/Allergies: Negative.   Psychiatric/Behavioral: Positive for depression and hallucinations. Negative for suicidal ideas, memory loss and substance abuse. The patient is nervous/anxious. The patient does not have insomnia.     Blood pressure 114/80, pulse 96, temperature 97.9 F (36.6 C), temperature source Oral, resp. rate 18, height 6' (1.829 m), weight 77.565 kg (171 lb).Body mass index is 23.19 kg/(m^2).  General Appearance: Casual  Eye Contact::  Fair  Speech:  Normal Rate  Volume:  Normal  Mood:  Anxious  Affect:  Flat  Thought Process:  Disorganized  Orientation:   Full (Time, Place, and Person)  Thought Content:  Delusions and Paranoid Ideation, visual hallucinations  Suicidal Thoughts:  No  Homicidal Thoughts:  No  Memory:  Immediate;   Fair Recent;   Fair Remote;   Fair  Judgement:  Impaired  Insight:  Lacking  Psychomotor Activity:  Decreased  Concentration:  Fair  Recall:  Fair  Akathisia:  No  Handed:  Right  AIMS (if indicated):     Assets:  Desire for Improvement Resilience Social Support  Sleep:  Number of Hours: 5.75   Current Medications: Current Facility-Administered Medications  Medication Dose Route Frequency Provider Last Rate Last Dose  . acetaminophen (TYLENOL) tablet 650 mg  650 mg Oral Q6H PRN Court Joy, PA-C   650 mg at 12/26/12 1059  . alum & mag hydroxide-simeth (MAALOX/MYLANTA) 200-200-20 MG/5ML suspension 30 mL  30 mL Oral Q4H PRN Court Joy, PA-C   30 mL at 12/30/12 2218  . ibuprofen (ADVIL,MOTRIN) tablet 400 mg  400 mg Oral Q8H PRN Wonda Cerise, MD   400 mg at 12/27/12 1713  . magnesium hydroxide (MILK OF MAGNESIA) suspension 30 mL  30 mL Oral Daily PRN Court Joy, PA-C      . risperiDONE (RISPERDAL) tablet 2 mg  2 mg Oral QHS Mojeed Akintayo   2 mg at 12/30/12 2201  . traZODone (DESYREL) tablet 50 mg  50 mg Oral QHS PRN,MR X 1 Mojeed Akintayo        Lab Results: No results found for this or any previous visit (  from the past 48 hour(s)).  Physical Findings: AIMS: Facial and Oral Movements Muscles of Facial Expression: None, normal Lips and Perioral Area: None, normal Jaw: None, normal Tongue: None, normal,Extremity Movements Upper (arms, wrists, hands, fingers): None, normal Lower (legs, knees, ankles, toes): None, normal, Trunk Movements Neck, shoulders, hips: None, normal, Overall Severity Severity of abnormal movements (highest score from questions above): None, normal Incapacitation due to abnormal movements: None, normal Patient's awareness of abnormal movements (rate only patient's  report): No Awareness, Dental Status Current problems with teeth and/or dentures?: Yes Does patient usually wear dentures?: No  CIWA:    COWS:     Treatment Plan Summary: Daily contact with patient to assess and evaluate symptoms and progress in treatment Medication management  Plan:  Review of chart, vital signs, medications, and notes. 1-Individual and group therapy 2-Medication management for psychosis/delusions, and anxiety: Trazodone  for sleep issues. Continue Risperdal to 2 mg for continued delusions and psychosis.  3-Coping skills for anxiety, delusions, psychosis 4-Continue crisis stabilization and management. Anticipate d/c tomorrow. 5-Address health issues--monitoring vital signs, stable 6-Treatment plan in progress to prevent relapse of psychosis/delusions, and anxiety 7- Continue current medication regimen  Medical Decision Making Problem Points:  Established problem, stable/improving (1) and Review of psycho-social stressors (1) Data Points:  Review of new medications or change in dosage (2)  I certify that inpatient services furnished can reasonably be expected to improve the patient's condition.   Fransisca Kaufmann, NP-C 12/31/2012, 12:38 PM

## 2012-12-31 NOTE — Progress Notes (Signed)
Pt started on Lisinopril at noon today, per MD order.

## 2012-12-31 NOTE — [facility] Suicide Risk Assessment (Signed)
[facility] INPATIENT:  Family/Significant Other Suicide Prevention Education  Suicide Prevention Education:  Education Completed; No one has been identified by the patient as the family member/significant other with whom the patient will be residing, and identified as the person(s) who will aid the patient in the event of a mental health crisis (suicidal ideations/suicide attempt).  With written consent from the patient, the family member/significant other has been provided the following suicide prevention education, prior to the and/or following the discharge of the patient.  The suicide prevention education provided includes the following:  Suicide risk factors  Suicide prevention and interventions  National Suicide Hotline telephone number  [facility] assessment telephone number  [facility] Emergency Assistance 911  [facility] and/or Residential Mobile Crisis Unit telephone number  Request made of family/significant other to:  Remove weapons (e.g., guns, rifles, knives), all items previously/currently identified as safety concern.    Remove drugs/medications (over-the-counter, prescriptions, illicit drugs), all items previously/currently identified as a safety concern.  The family member/significant other verbalizes understanding of the suicide prevention education information provided.  The family member/significant other agrees to remove the items of safety concern listed above. The patient did not endorse SI at the time of admission, nor did the patient c/o SI during the stay here.  SPE not required.   Daryel Gerald B 12/31/2012, 5:01 PM

## 2013-01-01 MED ORDER — TRAZODONE HCL 50 MG PO TABS
50.0000 mg | ORAL_TABLET | Freq: Every evening | ORAL | Status: DC | PRN
Start: 2013-01-01 — End: 2013-02-13

## 2013-01-01 MED ORDER — RISPERIDONE 2 MG PO TABS: 2.0000 mg | ORAL_TABLET | Freq: Every day | ORAL | Status: DC

## 2013-01-01 NOTE — Progress Notes (Signed)
D/C instructions/meds/follow-up appointments reviewed, pt verbalized understanding, pt's belongings returned to pt, samples given. 

## 2013-01-01 NOTE — Discharge Summary (Signed)
Physician Discharge Summary Note  Patient:  Alfred Jackson is an 52 y.o., male MRN:  [id number] DOB:  Mar 05, 1961 Patient phone:  [phone] (home)  Patient address:   [address] [place] [address]   Date of Admission:  12/26/2012 Date of Discharge: 01/01/13  Discharge Diagnoses: Principal Problem:   Psychosis Active Problems:   Schizophrenia  Axis Diagnosis:  AXIS I: Chronic Paranoid Shizophrenia  AXIS II: Deferred  AXIS III:  Past Medical History   Diagnosis  Date   .  Hepatitis C    .  Depression    .  Schizophrenia    .  Depression    .  Anxiety    .  Alcohol abuse    .  Bipolar 1 disorder     AXIS IV: economic problems, other psychosocial or environmental problems and problems related to social environment  AXIS V: 61-70 mild symptoms   Level of Care:  OP  Hospital Course:   This is a 52 year old male with hx depression and schizophrenia, c/o being off his meds for months, and states is hearing voices. Pt states doesn't know what they are saying. Is having paranoid thoughts. Also states feels depressed. Denies SI, or thoughts of harm to others. . Pt very poor historian. Reports recent pain in his knees.  While a patient in this hospital, Alfred Jackson was enrolled in group counseling and activities as well as received the following medication Current facility-administered medications:acetaminophen (TYLENOL) tablet 650 mg, 650 mg, Oral, Q6H PRN, Court Joy, PA-C, 650 mg at 12/26/12 1059;  alum & mag hydroxide-simeth (MAALOX/MYLANTA) 200-200-20 MG/5ML suspension 30 mL, 30 mL, Oral, Q4H PRN, Court Joy, PA-C, 30 mL at 12/30/12 2218;  ibuprofen (ADVIL,MOTRIN) tablet 400 mg, 400 mg, Oral, Q8H PRN, Wonda Cerise, MD, 400 mg at 12/27/12 1713 magnesium hydroxide (MILK OF MAGNESIA) suspension 30 mL, 30 mL, Oral, Daily PRN, Court Joy, PA-C;  risperiDONE (RISPERDAL) tablet 2 mg, 2 mg, Oral, QHS, Mojeed Akintayo, 2 mg at 12/31/12 2210;  traZODone (DESYREL) tablet  50 mg, 50 mg, Oral, QHS PRN,MR X 1, Mojeed Akintayo The patient had not been taking any psychotropic medications prior to his admission due to have been off them for several months. The patient was started on Risperdal 2 mg at hs for psychosis and Trazodone 50 mg at hs with repeat for improved quality of sleep. Alfred Jackson was initially disorganized in his thought processes and was observed responding to internal stimuli when laughing inappropriately during conversations. He attended the group meetings and interacted with peers. Patient did not display any behavior problems during his admission. The importance of medication compliance to avoid hospital readmission was stressed to the patient. Prior to his discharge the patient denied feeling suicidal or hearing any voices. Patient attended treatment team meeting this am and met with treatment team members. Pt symptoms, treatment plan and response to treatment discussed. Alfred Jackson endorsed that their symptoms have improved. Pt also stated that they are stable for discharge.  In other to control Principal Problem:   Psychosis Active Problems:   Schizophrenia , they will continue psychiatric care on outpatient basis. They will follow-up at      Follow-up Information   Follow up with [facility] On 01/01/2013. (Go to the walk-in clinic M-F between 8 and 9AM for your hospital follow up appointment)    Contact information:   [address] [place] [address] Phone: [phone] Fax: [phone]    .  In addition they were instructed to take all your medications as prescribed by your mental healthcare provider, to report any adverse effects and or reactions from your medicines to your outpatient provider promptly, patient is instructed and cautioned to not engage in alcohol and or illegal drug use while on prescription medicines, in the event of worsening symptoms, patient is instructed to call the crisis hotline, 911 and or go to the nearest ED for  appropriate evaluation and treatment of symptoms.   Upon discharge, patient adamantly denies suicidal, homicidal ideations, auditory, visual hallucinations and or delusional thinking. They left [facility] with all personal belongings in no apparent distress.  Consults:  See electronic record for details  Significant Diagnostic Studies:  See electronic record for details  Discharge Vitals:   Blood pressure 114/80, pulse 96, temperature 97.9 F (36.6 C), temperature source Oral, resp. rate 18, height 6' (1.829 m), weight 77.565 kg (171 lb)..  Mental Status Exam: See Mental Status Examination and Suicide Risk Assessment completed by Attending Physician prior to discharge.  Discharge destination:  Home  Is patient on multiple antipsychotic therapies at discharge:  No  Has Patient had three or more failed trials of antipsychotic monotherapy by history: N/A Recommended Plan for Multiple Antipsychotic Therapies: N/A    Medication List       Indication   risperiDONE 2 MG tablet  Commonly known as:  RISPERDAL  Take 1 tablet (2 mg total) by mouth at bedtime.   Indication:  Schizophrenia     traZODone 50 MG tablet  Commonly known as:  DESYREL  Take 1 tablet (50 mg total) by mouth at bedtime as needed and may repeat dose one time if needed for sleep.   Indication:  Trouble Sleeping       Follow-up Information   Follow up with [facility] On 01/01/2013. (Go to the walk-in clinic M-F between 8 and 9AM for your hospital follow up appointment)    Contact information:   [address] [place] [address] Phone: [phone] Fax: [phone]     Follow-up recommendations:   Activities: Resume typical activities Diet: Resume typical diet Tests: none Other: Follow up with outpatient provider and report any side effects to out patient prescriber.  Comments:  Take all your medications as prescribed by your mental healthcare provider. Report any adverse effects and or reactions from your  medicines to your outpatient provider promptly. Patient is instructed and cautioned to not engage in alcohol and or illegal drug use while on prescription medicines. In the event of worsening symptoms, patient is instructed to call the crisis hotline, 911 and or go to the nearest ED for appropriate evaluation and treatment of symptoms. Follow-up with your primary care provider for your other medical issues, concerns and or health care needs.  SignedFransisca Kaufmann NP-C 01/01/2013 12:25 PM

## 2013-01-01 NOTE — Progress Notes (Signed)
Patient ID: Alfred Jackson, male   DOB: 03/07/1961, 52 y.o.   MRN: [id number]   D: Pt observed sleeping in bed with eyes closed. RR even and unlabored. No distress noted  .  A: Q 15 minute checks were done for safety.  R: safety maintained on unit.

## 2013-01-01 NOTE — [facility] Suicide Risk Assessment (Signed)
Suicide Risk Assessment  Discharge Assessment     Demographic Factors:  Male, Low socioeconomic status and Unemployed  Mental Status Per Nursing Assessment::   On Admission:  Self-harm thoughts  Current Mental Status by Physician: patient denies suicidal ideation, intent or plan  Loss Factors: Financial problems/change in socioeconomic status  Historical Factors: Impulsivity  Risk Reduction Factors:   Positive social support and Positive therapeutic relationship  Continued Clinical Symptoms:  Resolving psychosis and anxiety  Cognitive Features That Contribute To Risk:  Closed-mindedness Polarized thinking    Suicide Risk:  Minimal: No identifiable suicidal ideation.  Patients presenting with no risk factors but with morbid ruminations; may be classified as minimal risk based on the severity of the depressive symptoms  Discharge Diagnoses:   AXIS I:  Chronic Paranoid Shizophrenia  AXIS II:  Deferred AXIS III:   Past Medical History  Diagnosis Date  . Hepatitis C   . Depression   . Schizophrenia   . Depression   . Anxiety   . Alcohol abuse   . Bipolar 1 disorder    AXIS IV:  economic problems, other psychosocial or environmental problems and problems related to social environment AXIS V:  61-70 mild symptoms  Plan Of Care/Follow-up recommendations:  Activity:  as tolerated Diet:  healthy Tests:  routine blood test Other:  patient to keep his after care appointment  Is patient on multiple antipsychotic therapies at discharge:  No   Has Patient had three or more failed trials of antipsychotic monotherapy by history:  No  Recommended Plan for Multiple Antipsychotic Therapies: N/A  Amiere Cawley,MD 01/01/2013, 8:39 AM

## 2013-01-01 NOTE — Tx Team (Signed)
  Interdisciplinary Treatment Plan Update   Date Reviewed:  01/01/2013  Time Reviewed:  11:25 AM  Progress in Treatment:   Attending groups: Yes Participating in groups: Yes Taking medication as prescribed: Yes  Tolerating medication: Yes Family/Significant other contact made: No  Patient understands diagnosis: Yes  Discussing patient identified problems/goals with staff: Yes Medical problems stabilized or resolved: Yes Denies suicidal/homicidal ideation: Yes Patient has not harmed self or others: Yes  For review of initial/current patient goals, please see plan of care.  Estimated Length of Stay:  D/C today  Reason for Continuation of Hospitalization:   New Problems/Goals identified:  N/A  Discharge Plan or Barriers:   return home, follow up outpt  Additional Comments:  Attendees:  Signature: Thedore Mins, MD 01/01/2013 11:25 AM   Signature: Richelle Ito, LCSW 01/01/2013 11:25 AM  Signature: Fransisca Kaufmann, NP 01/01/2013 11:25 AM  Signature:  01/01/2013 11:25 AM  Signature: Nestor Ramp, RN 01/01/2013 11:25 AM  Signature:  01/01/2013 11:25 AM  Signature:   01/01/2013 11:25 AM  Signature:    Signature:    Signature:    Signature:    Signature:    Signature:      Scribe for Treatment Team:   Richelle Ito, LCSW  01/01/2013 11:25 AM

## 2013-01-01 NOTE — Progress Notes (Signed)
[facility] Adult Case Management Discharge Plan :  Will you be returning to the same living situation after discharge: Yes,  home At discharge, do you have transportation home?:Yes,  bus pass Do you have the ability to pay for your medications:Yes,  mental health  Release of information consent forms completed and in the chart;  Patient's signature needed at discharge.  Patient to Follow up at: Follow-up Information   Follow up with [facility] On 01/01/2013. (Go to the walk-in clinic M-F between 8 and 9AM for your hospital follow up appointment)    Contact information:   [address] [place] [address] Phone: [phone] Fax: [phone]      Patient denies SI/HI:   Yes,  yes    Safety Planning and Suicide Prevention discussed:  Yes,  yes  [name] 01/01/2013, 11:22 AM

## 2013-01-01 NOTE — Progress Notes (Signed)
Recreation Therapy Notes  Date: 08.22.2014 Time: 9:30am Location: [address] Dayroom  Group Topic: Wellness  Goal Area(s) Addresses:  Patient will define components of whole wellness. Patient will verbalize benefit of whole wellness.  Behavioral Response: Appropriate, Attentive, Engaged  Intervention: Trivia Game  Activity: Health and Wellness [employer]. Patients were asked various questions relating to health and wellness.    Education: Discharge Planning, Wellness  Education Outcome: Acknowledges understanding   Clinical Observations/Feedback: Patient attended group session and engaged in activity. Patient answered questions without hesitate and with confidence. Patient was observed to laugh and smile with peers during session.   Marykay Lex Orlan Aversa, LRT/CTRS  Jearl Klinefelter 01/01/2013 12:45 PM

## 2013-01-04 NOTE — Discharge Summary (Signed)
Seen and agreed. Zevin Nevares, MD 

## 2013-01-06 NOTE — Progress Notes (Signed)
Patient Discharge Instructions:  After Visit Summary (AVS):   Faxed to:  01/06/13 Discharge Summary Note:   Faxed to:  01/06/13 Psychiatric Admission Assessment Note:   Faxed to:  01/06/13 Suicide Risk Assessment - Discharge Assessment:   Faxed to:  01/06/13 Faxed/Sent to the Next Level Care provider:  01/06/13 Faxed to [facility] @ [phone]  Jerelene Redden, 01/06/2013, 5:10 PM

## 2013-02-13 ENCOUNTER — Encounter (HOSPITAL_COMMUNITY): Payer: Self-pay | Admitting: Emergency Medicine

## 2013-02-13 ENCOUNTER — Emergency Department (HOSPITAL_COMMUNITY)
Admission: EM | Admit: 2013-02-13 | Discharge: 2013-02-13 | Disposition: A | Discharge status: Home / Self Care | Payer: Self-pay | Attending: Emergency Medicine | Admitting: Emergency Medicine

## 2013-02-13 DIAGNOSIS — F10929 Alcohol use, unspecified with intoxication, unspecified: Secondary | ICD-10-CM

## 2013-02-13 DIAGNOSIS — F209 Schizophrenia, unspecified: Secondary | ICD-10-CM | POA: Insufficient documentation

## 2013-02-13 DIAGNOSIS — F17200 Nicotine dependence, unspecified, uncomplicated: Secondary | ICD-10-CM | POA: Insufficient documentation

## 2013-02-13 DIAGNOSIS — R4182 Altered mental status, unspecified: Secondary | ICD-10-CM | POA: Insufficient documentation

## 2013-02-13 DIAGNOSIS — F411 Generalized anxiety disorder: Secondary | ICD-10-CM | POA: Insufficient documentation

## 2013-02-13 DIAGNOSIS — Z79899 Other long term (current) drug therapy: Secondary | ICD-10-CM | POA: Insufficient documentation

## 2013-02-13 DIAGNOSIS — F1010 Alcohol abuse, uncomplicated: Secondary | ICD-10-CM | POA: Insufficient documentation

## 2013-02-13 DIAGNOSIS — F3130 Bipolar disorder, current episode depressed, mild or moderate severity, unspecified: Secondary | ICD-10-CM | POA: Insufficient documentation

## 2013-02-13 DIAGNOSIS — Z8619 Personal history of other infectious and parasitic diseases: Secondary | ICD-10-CM | POA: Insufficient documentation

## 2013-02-13 LAB — CBC WITH DIFFERENTIAL/PLATELET
Basophils Absolute: 0 10*3/uL (ref 0.0–0.1)
Basophils Relative: 1 % (ref 0–1)
Lymphocytes Relative: 40 % (ref 12–46)
Lymphs Abs: 2.4 10*3/uL (ref 0.7–4.0)
Neutrophils Relative %: 50 % (ref 43–77)
Platelets: 427 10*3/uL — ABNORMAL HIGH (ref 150–400)
RBC: 4.38 MIL/uL (ref 4.22–5.81)
WBC: 6 10*3/uL (ref 4.0–10.5)

## 2013-02-13 LAB — POCT I-STAT, CHEM 8
Calcium, Ion: 1.1 mmol/L — ABNORMAL LOW (ref 1.12–1.23)
Chloride: 108 mEq/L (ref 96–112)
Glucose, Bld: 119 mg/dL — ABNORMAL HIGH (ref 70–99)
HCT: 43 % (ref 39.0–52.0)
TCO2: 26 mmol/L (ref 0–100)

## 2013-02-13 LAB — ETHANOL: Alcohol, Ethyl (B): 203 mg/dL — ABNORMAL HIGH (ref 0–11)

## 2013-02-13 NOTE — ED Notes (Signed)
Bed: UE45 Expected date: 02/13/13 Expected time: 1:39 AM Means of arrival: Ambulance Comments: Bed 12, EMS, 51 M, Poss Allergic Rxn

## 2013-02-13 NOTE — ED Provider Notes (Signed)
CSN: [id number]     Arrival date & time 02/13/13  0148 History   First MD Initiated Contact with Patient 02/13/13 0231     Chief Complaint  Patient presents with  . Altered Mental Status  . Alcohol Intoxication   (Consider location/radiation/quality/duration/timing/severity/associated sxs/prior Treatment) HPI History per EMS: Girlfriend at home concerned about patient's medication and abnormal behavior tonight. She does not accompany patient to the emergency department.  Recently increased her Risperdal dose, has history of bipolar and schizophrenia, and alcohol abuse. No other medication changes or missed doses.  History provided by patient. Drinking alcohol tonight. Has no specific complaints on arrival to the emergency department, requesting to be discharged home. He denies any hallucinations. He denies any other ingestions. No suicidal or homicidal ideation  Past Medical History  Diagnosis Date  . Hepatitis C   . Depression   . Schizophrenia   . Depression   . Anxiety   . Alcohol abuse   . Bipolar 1 disorder    History reviewed. No pertinent past surgical history. History reviewed. No pertinent family history. History  Substance Use Topics  . Smoking status: Current Every Day Smoker -- 0.50 packs/day  . Smokeless tobacco: Not on file  . Alcohol Use: Yes    Review of Systems  Constitutional: Negative for fever and chills.  HENT: Negative for neck pain.   Eyes: Negative for visual disturbance.  Respiratory: Negative for shortness of breath.   Cardiovascular: Negative for chest pain.  Gastrointestinal: Negative for abdominal pain.  Genitourinary: Negative for flank pain.  Musculoskeletal: Negative for back pain.  Skin: Negative for rash.  Neurological: Negative for headaches.  All other systems reviewed and are negative.    Allergies  Review of patient's allergies indicates no known allergies.  Home Medications   Current Outpatient Rx  Name  Route  Sig  Dispense   Refill  . risperiDONE (RISPERDAL) 2 MG tablet   Oral   Take 1 tablet (2 mg total) by mouth at bedtime.   30 tablet   0    BP 115/69  Pulse 81  Temp(Src) 97.4 F (36.3 C) (Oral)  Resp 18  SpO2 99% Physical Exam  Constitutional: He is oriented to person, place, and time. He appears well-developed and well-nourished.  HENT:  Head: Normocephalic and atraumatic.  Eyes: EOM are normal. Pupils are equal, round, and reactive to light.  Neck: Neck supple.  Cardiovascular: Regular rhythm and intact distal pulses.   Pulmonary/Chest: Effort normal and breath sounds normal. No respiratory distress.  Abdominal: Soft. Bowel sounds are normal. He exhibits no distension. There is no tenderness. There is no rebound.  Musculoskeletal: Normal range of motion. He exhibits no edema.  Neurological: He is alert and oriented to person, place, and time.  Skin: Skin is warm and dry.  Psychiatric:  No psychosis. Cooperative and appropriate    ED Course  Procedures (including critical care time) Labs Review Labs Reviewed  ETHANOL - Abnormal; Notable for the following:    Alcohol, Ethyl (B) 203 (*)    All other components within normal limits  CBC WITH DIFFERENTIAL - Abnormal; Notable for the following:    Platelets 427 (*)    All other components within normal limits  POCT I-STAT, CHEM 8 - Abnormal; Notable for the following:    Sodium 147 (*)    Glucose, Bld 119 (*)    Calcium, Ion 1.10 (*)    All other components within normal limits  URINE RAPID DRUG SCREEN (HOSP  PERFORMED)   Patient sobering in the emergency department. He has been appropriate and interactive without indication for psychiatric admit at this time. Tolerating oral fluids eating crackers.  Plan discharge home and outpatient followup with return precautions provided  MDM  Diagnosis: Alcohol intoxication, history of bipolar  Labs reviewed as above. Vital signs, previous records and nursing notes reviewed  Sunnie Nielsen,  MD 02/13/13 [phone]

## 2013-02-13 NOTE — ED Notes (Signed)
Per EMS, pt. From home who was reported by girlfriend that pt. Started 'acting weird" since his risperdal was increased by his doctor two days ago. Pt.also was reported of drinking half a pint of liquor this evening. Pt. Is oriented to self and place only. Also reported of  Restlessness with manic behavior.

## 2013-05-21 ENCOUNTER — Encounter (HOSPITAL_COMMUNITY): Payer: Self-pay | Admitting: Emergency Medicine

## 2013-05-21 ENCOUNTER — Emergency Department (HOSPITAL_COMMUNITY): Payer: Self-pay

## 2013-05-21 ENCOUNTER — Emergency Department (HOSPITAL_COMMUNITY)
Admission: EM | Admit: 2013-05-21 | Discharge: 2013-05-21 | Disposition: A | Discharge status: Home / Self Care | Payer: Self-pay | Attending: Emergency Medicine | Admitting: Emergency Medicine

## 2013-05-21 DIAGNOSIS — Z8659 Personal history of other mental and behavioral disorders: Secondary | ICD-10-CM | POA: Insufficient documentation

## 2013-05-21 DIAGNOSIS — R079 Chest pain, unspecified: Secondary | ICD-10-CM | POA: Insufficient documentation

## 2013-05-21 DIAGNOSIS — T50902A Poisoning by unspecified drugs, medicaments and biological substances, intentional self-harm, initial encounter: Secondary | ICD-10-CM | POA: Insufficient documentation

## 2013-05-21 DIAGNOSIS — Z8619 Personal history of other infectious and parasitic diseases: Secondary | ICD-10-CM | POA: Insufficient documentation

## 2013-05-21 DIAGNOSIS — F17200 Nicotine dependence, unspecified, uncomplicated: Secondary | ICD-10-CM | POA: Insufficient documentation

## 2013-05-21 DIAGNOSIS — T50901A Poisoning by unspecified drugs, medicaments and biological substances, accidental (unintentional), initial encounter: Secondary | ICD-10-CM | POA: Insufficient documentation

## 2013-05-21 HISTORY — DX: Poisoning by unspecified drugs, medicaments and biological substances, intentional self-harm, initial encounter: T50.902A

## 2013-05-21 LAB — ACETAMINOPHEN LEVEL

## 2013-05-21 LAB — BASIC METABOLIC PANEL
BUN: 11 mg/dL (ref 6–23)
CO2: 23 mEq/L (ref 19–32)
Calcium: 8.6 mg/dL (ref 8.4–10.5)
Chloride: 100 mEq/L (ref 96–112)
Creatinine, Ser: 0.94 mg/dL (ref 0.50–1.35)
GFR calc Af Amer: >90 mL/min (ref >90)
GLUCOSE: 153 mg/dL — AB (ref 70–99)
Potassium: 3.9 mEq/L (ref 3.7–5.3)
Sodium: 138 mEq/L (ref 137–147)

## 2013-05-21 LAB — CBC WITH DIFFERENTIAL/PLATELET
Basophils Absolute: 0 10*3/uL (ref 0.0–0.1)
Basophils Relative: 0 % (ref 0–1)
EOS ABS: 0 10*3/uL (ref 0.0–0.7)
Eosinophils Relative: 0 % (ref 0–5)
HCT: 42.1 % (ref 39.0–52.0)
HEMOGLOBIN: 14.6 g/dL (ref 13.0–17.0)
Lymphocytes Relative: 11 % — ABNORMAL LOW (ref 12–46)
Lymphs Abs: 1.6 10*3/uL (ref 0.7–4.0)
MCH: 32.2 pg (ref 26.0–34.0)
MCHC: 34.7 g/dL (ref 30.0–36.0)
MCV: 92.7 fL (ref 78.0–100.0)
MONOS PCT: 7 % (ref 3–12)
Monocytes Absolute: 0.9 10*3/uL (ref 0.1–1.0)
Neutro Abs: 11.6 10*3/uL — ABNORMAL HIGH (ref 1.7–7.7)
Neutrophils Relative %: 81 % — ABNORMAL HIGH (ref 43–77)
Platelets: 372 10*3/uL (ref 150–400)
RBC: 4.54 MIL/uL (ref 4.22–5.81)
RDW: 13 % (ref 11.5–15.5)
WBC: 14.3 10*3/uL — ABNORMAL HIGH (ref 4.0–10.5)

## 2013-05-21 LAB — POCT I-STAT TROPONIN I
TROPONIN I, POC: 0.01 ng/mL (ref 0.00–0.08)
Troponin i, poc: 0.01 ng/mL (ref 0.00–0.08)

## 2013-05-21 LAB — SALICYLATE LEVEL: Salicylate Lvl: <2 mg/dL — ABNORMAL LOW (ref 2.8–20.0)

## 2013-05-21 LAB — ETHANOL: Alcohol, Ethyl (B): <11 mg/dL (ref 0–11)

## 2013-05-21 MED ORDER — KETOROLAC TROMETHAMINE 30 MG/ML IJ SOLN
30.0000 mg | Freq: Once | INTRAMUSCULAR | Status: AC
  Administered 2013-05-21: 30 mg via INTRAVENOUS
  Filled 2013-05-21: qty 1

## 2013-05-21 NOTE — ED Notes (Signed)
Pt reports found a plastic bag on street with a white powder in it. Pt took home & snorted it thinking that it was cocaine. Pt denies SI, HI. No voiced complaints presently.

## 2013-05-21 NOTE — ED Notes (Signed)
Found by family lying on floor, unresponsive & "blue lips". Upon EMS arrival, pt cyanotic, requiring assisted ventilations via BVM. Given total of 4mg  narcan with improvement of LOC & respirations. Pt A&OX4 upon arrival to ED.

## 2013-05-21 NOTE — ED Notes (Signed)
Pt returned from xray.  Alert and oriented no distress

## 2013-05-21 NOTE — ED Notes (Signed)
The pt remains alert talking to his family vitals stable no distress.  To xray

## 2013-05-21 NOTE — ED Provider Notes (Signed)
CSN: [id number]     Arrival date & time 05/21/13  1653 History   First MD Initiated Contact with Patient 05/21/13 1654     Chief Complaint  Patient presents with  . Drug Overdose   (Consider location/radiation/quality/duration/timing/severity/associated sxs/prior Treatment) HPI  This 53 year old male who presents following ingestion. Patient has history of depression, schizophrenia, anxiety, alcohol abuse, and prior suicide attempt who presents by EMS after being found unresponsive. He was found by his family. Upon EMS arrival, he was cyanotic. He was given a total of 2 mg of Narcan with improvement of his mental status. Initially respirations were 3 but that improved as well with Narcan. Upon arrival, he is awake, alert, and oriented. He is not receiving respiratory assistance. Patient states that he "found some powder and snorted it." He is unsure of what exactly he snorted. He denies SI or HI at this time. When asked why he snorted the powder, patient states "I thought it would get me high."  Patient does endorse right-sided chest pain. He states it has been ongoing and constant for the last 3 days. He denies any injury. He denies any shortness of breath. Nothing makes it better and it is worsened by movement. Current pain is 4/10.   Past Medical History  Diagnosis Date  . Hepatitis C   . Depression   . Schizophrenia   . Depression   . Anxiety   . Alcohol abuse   . Bipolar 1 disorder   . Suicide attempt by drug ingestion    History reviewed. No pertinent past surgical history. No family history on file. History  Substance Use Topics  . Smoking status: Current Every Day Smoker -- 0.50 packs/day  . Smokeless tobacco: Not on file  . Alcohol Use: Yes    Review of Systems  Constitutional: Negative.  Negative for fever.  Respiratory: Negative.  Negative for chest tightness and shortness of breath.   Cardiovascular: Positive for chest pain.  Gastrointestinal: Negative.  Negative for  abdominal pain.  Genitourinary: Negative.  Negative for dysuria.  Musculoskeletal: Negative for back pain.  Neurological: Negative for headaches.  Psychiatric/Behavioral: Negative for suicidal ideas.  All other systems reviewed and are negative.    Allergies  Review of patient's allergies indicates no known allergies.  Home Medications  No current outpatient prescriptions on file. BP 134/76  Pulse 57  Temp(Src) 99 F (37.2 C) (Oral)  Resp 13  Ht 5\' 11"  (1.803 m)  Wt 175 lb (79.379 kg)  BMI 24.42 kg/m2  SpO2 99% Physical Exam  Nursing note and vitals reviewed. Constitutional: He is oriented to person, place, and time. No distress.  Disheveled  HENT:  Head: Normocephalic and atraumatic.  Eyes: Pupils are equal, round, and reactive to light.  Pupils 2 mm and reactive bilaterally  Neck: Neck supple.  Cardiovascular: Normal rate, regular rhythm and normal heart sounds.   No murmur heard. Pulmonary/Chest: Effort normal and breath sounds normal. No respiratory distress. He has no wheezes. He exhibits tenderness.  Abdominal: Soft. Bowel sounds are normal. There is no tenderness. There is no rebound.  Musculoskeletal: He exhibits no edema.  Lymphadenopathy:    He has no cervical adenopathy.  Neurological: He is alert and oriented to person, place, and time.  Skin: Skin is warm and dry.  Psychiatric: He has a normal mood and affect.    ED Course  Procedures (including critical care time) Labs Review Labs Reviewed  CBC WITH DIFFERENTIAL - Abnormal; Notable for the following:  WBC 14.3 (*)    Neutrophils Relative % 81 (*)    Neutro Abs 11.6 (*)    Lymphocytes Relative 11 (*)    All other components within normal limits  BASIC METABOLIC PANEL - Abnormal; Notable for the following:    Glucose, Bld 153 (*)    All other components within normal limits  SALICYLATE LEVEL - Abnormal; Notable for the following:    Salicylate Lvl <2.0 (*)    All other components within normal  limits  ETHANOL  ACETAMINOPHEN LEVEL  URINE RAPID DRUG SCREEN (HOSP PERFORMED)  POCT I-STAT TROPONIN I  POCT I-STAT TROPONIN I   Imaging Review Dg Chest 2 View  05/21/2013   CLINICAL DATA:  Heroin overdose.  EXAM: CHEST  2 VIEW  COMPARISON:  11/18/2012  FINDINGS: The cardiac silhouette, mediastinal and hilar contours are within normal limits and stable. The lungs are clear of acute process. Mild emphysematous changes are stable. Stable small probable calcified granulomas in both lungs. Minimal left basilar atelectasis. No pleural effusion. The bony thorax is intact.  IMPRESSION: Chronic lung changes and minimal left basilar atelectasis. No infiltrates, edema or effusions.   Electronically Signed   By: Loralie ChampagneMark  Gallerani M.D.   On: 05/21/2013 18:22    EKG Interpretation    Date/Time:  Friday May 21 2013 16:59:26 EST Ventricular Rate:  78 PR Interval:  141 QRS Duration: 106 QT Interval:  409 QTC Calculation: 466 R Axis:   73 Text Interpretation:  Sinus rhythm Probable left atrial enlargement RSR' in V1 or V2, right VCD or RVH Probable inferior infarct, old No significant change since Confirmed by Anberlyn Feimster  MD, Yuvraj Pfeifer ([id number]) on 05/21/2013 5:08:22 PM            MDM   1. Ingestion of unknown drug, initial encounter    Patient presents by EMS after snorting an unknown substance. He was found unresponsive. He did respond to Narcan and is currently awake, alert, and oriented. Patient denies suicidal ideation and states he was just trying to get high.  He is complaining of right-sided chest pain  With reproducible pain on exam. EKG and troponin were obtained and show no evidence of acute ischemia. Lab work is remarkable for mild leukocytosis at 14.3. This is of unknown clinical significance.  Ethanol, salicylate, and Tylenol levels are negative. Patient refused to provide urine.  He was given Toradol for his chest pain.  Patient has had no recurrence of altered mental status while here. He  will be discharged home with his family.  After history, exam, and medical workup I feel the patient has been appropriately medically screened and is safe for discharge home. Pertinent diagnoses were discussed with the patient. Patient was given return precautions.     Shon Batonourtney F Epic Tribbett, MD 05/21/13 2239

## 2013-05-21 NOTE — Discharge Instructions (Signed)
Nontoxic Ingestion  Your exam shows your ingestion is not likely to cause serious medical problems. Further treatment is not needed at this time. If you have vomited since your ingestion, you should not drink or eat for at least 2 to 3 hours. Then start with small sips of clear liquids until your stomach settles. You should not drink alcohol or take illegal recreational drugs or other mind-altering substances as this may worsen your condition. Sometimes the effects of drugs and other substances can be delayed.  SEEK IMMEDIATE MEDICAL CARE IF:  · You develop confusion, sleepiness, agitation, or difficulty walking.  · You develop breathing problems, a cough, difficulty swallowing, or excess mucus.  · You develop a stomach ache, repeated vomiting, or severe diarrhea.  · You develop weakness, fever, or dehydration.  Document Released: 06/06/2004 Document Revised: 07/22/2011 Document Reviewed: 05/30/2008  [employer]® Patient Information ©2014 [employer].

## 2013-12-14 ENCOUNTER — Encounter (HOSPITAL_COMMUNITY): Payer: Self-pay | Admitting: Emergency Medicine

## 2013-12-14 ENCOUNTER — Emergency Department (HOSPITAL_COMMUNITY)
Admission: EM | Admit: 2013-12-14 | Discharge: 2013-12-14 | Disposition: A | Discharge status: Home / Self Care | Payer: Self-pay | Attending: Emergency Medicine | Admitting: Emergency Medicine

## 2013-12-14 DIAGNOSIS — F209 Schizophrenia, unspecified: Secondary | ICD-10-CM | POA: Insufficient documentation

## 2013-12-14 DIAGNOSIS — F17200 Nicotine dependence, unspecified, uncomplicated: Secondary | ICD-10-CM | POA: Insufficient documentation

## 2013-12-14 DIAGNOSIS — F1010 Alcohol abuse, uncomplicated: Secondary | ICD-10-CM | POA: Insufficient documentation

## 2013-12-14 DIAGNOSIS — Z8619 Personal history of other infectious and parasitic diseases: Secondary | ICD-10-CM | POA: Insufficient documentation

## 2013-12-14 DIAGNOSIS — Z008 Encounter for other general examination: Secondary | ICD-10-CM | POA: Insufficient documentation

## 2013-12-14 NOTE — ED Notes (Signed)
Pt refuses to change into paper scrubs.

## 2013-12-14 NOTE — ED Provider Notes (Signed)
CSN: [id number]     Arrival date & time 12/14/13  1833 History   First MD Initiated Contact with Patient 12/14/13 1914     Chief Complaint  Patient presents with  . Psychiatric Evaluation     (Consider location/radiation/quality/duration/timing/severity/associated sxs/prior Treatment) HPI 53 year old male brought to the emergency department by the police for an unknown reason, patient states he and his girlfriend were both drinking today, he states he does not know why she called the [facility], he denies any threats to harm himself or others he denies any hallucinations he denies wanting detox, he denies any concerns whatsoever, he states there was no argument and he does not feel intoxicated now, he would like discharge.  Past Medical History  Diagnosis Date  . Hepatitis C   . Depression   . Schizophrenia   . Depression   . Anxiety   . Alcohol abuse   . Bipolar 1 disorder   . Suicide attempt by drug ingestion    History reviewed. No pertinent past surgical history. No family history on file. History  Substance Use Topics  . Smoking status: Current Every Day Smoker -- 0.50 packs/day  . Smokeless tobacco: Not on file  . Alcohol Use: Yes    Review of Systems 10 Systems reviewed and are negative for acute change except as noted in the HPI.   Allergies  Review of patient's allergies indicates no known allergies.  Home Medications   Prior to Admission medications   Not on File   BP 149/97  Pulse 93  Temp(Src) 98.8 F (37.1 C) (Oral)  Resp 16  SpO2 98% Physical Exam  Nursing note and vitals reviewed. Constitutional: He is oriented to person, place, and time.  Awake, alert, nontoxic appearance with baseline speech for patient.  HENT:  Head: Atraumatic.  Mouth/Throat: No oropharyngeal exudate.  Eyes: EOM are normal. Pupils are equal, round, and reactive to light. Right eye exhibits no discharge. Left eye exhibits no discharge.  Neck: Neck supple.   Cardiovascular: Normal rate and regular rhythm.   No murmur heard. Pulmonary/Chest: Effort normal and breath sounds normal. No stridor. No respiratory distress. He has no wheezes. He has no rales. He exhibits no tenderness.  Abdominal: Soft. Bowel sounds are normal. He exhibits no mass. There is no tenderness. There is no rebound.  Musculoskeletal: He exhibits no tenderness.  Baseline ROM, moves extremities with no obvious new focal weakness.  Lymphadenopathy:    He has no cervical adenopathy.  Neurological: He is alert and oriented to person, place, and time.  Awake, alert, cooperative and aware of situation; motor strength 5/5 bilaterally; sensation normal to light touch bilaterally; peripheral visual fields full to confrontation; no facial asymmetry; tongue midline; major cranial nerves appear intact; no pronator drift, normal finger to nose bilaterally, baseline gait without new ataxia.  Skin: No rash noted.  Psychiatric: He has a normal mood and affect.    ED Course  Procedures (including critical care time) Patient arrives with normal speech normal gait is oriented to person place and time denies threat to harm himself or others, denies hallucinations, does not want detox, and appears to have medical decision-making capacity to request discharge and does not appear to meet criteria for involuntary commitment. Labs Review Labs Reviewed - No data to display  Imaging Review No results found.   EKG Interpretation None      MDM   Final diagnoses:  Alcohol abuse  Schizophrenia, unspecified type    Patient informed of clinical  course, understand medical decision-making process, and agree with plan. I doubt any other EMC precluding discharge at this time including, but not necessarily limited to the following:SI, HI, hallucinations, severe withdrawal.  Hurman HornJohn M Johnmichael Melhorn, MD 12/18/13 1800

## 2013-12-14 NOTE — ED Notes (Addendum)
Pt presents to ED with a sheriff from home for a psychiatric evaluation.  Pt states he feels fine but his girlfriend made him choose between ED or jail and he chose to come to ED.  Pt denies SI/HI, denies ETOH use today.  Pt states "I started at the bottom and now I'm at the top and I'm fixing to blow up".  Pt having conversations with different voices at present.  Pt cooperative in triage.

## 2013-12-14 NOTE — Discharge Instructions (Signed)
Alcohol Use Disorder °Alcohol use disorder is a mental disorder. It is not a one-time incident of heavy drinking. Alcohol use disorder is the excessive and uncontrollable use of alcohol over time that leads to problems with functioning in one or more areas of daily living. People with this disorder risk harming themselves and others when they drink to excess. Alcohol use disorder also can cause other mental disorders, such as mood and anxiety disorders, and serious physical problems. People with alcohol use disorder often misuse other drugs.  °Alcohol use disorder is common and widespread. Some people with this disorder drink alcohol to cope with or escape from negative life events. Others drink to relieve chronic pain or symptoms of mental illness. People with a family history of alcohol use disorder are at higher risk of losing control and using alcohol to excess.  °SYMPTOMS  °Signs and symptoms of alcohol use disorder may include the following:  °· Consumption of alcohol in larger amounts or over a longer period of time than intended. °· Multiple unsuccessful attempts to cut down or control alcohol use.   °· A great Lumm of time spent obtaining alcohol, using alcohol, or recovering from the effects of alcohol (hangover). °· A strong desire or urge to use alcohol (cravings).   °· Continued use of alcohol despite problems at work, school, or home because of alcohol use.   °· Continued use of alcohol despite problems in relationships because of alcohol use. °· Continued use of alcohol in situations when it is physically hazardous, such as driving a car. °· Continued use of alcohol despite awareness of a physical or psychological problem that is likely related to alcohol use. Physical problems related to alcohol use can involve the brain, heart, liver, stomach, and intestines. Psychological problems related to alcohol use include intoxication, depression, anxiety, psychosis, delirium, and dementia.   °· The need for  increased amounts of alcohol to achieve the same desired effect, or a decreased effect from the consumption of the same amount of alcohol (tolerance). °· Withdrawal symptoms upon reducing or stopping alcohol use, or alcohol use to reduce or avoid withdrawal symptoms. Withdrawal symptoms include: °¨ Racing heart. °¨ Hand tremor. °¨ Difficulty sleeping. °¨ Nausea. °¨ Vomiting. °¨ Hallucinations. °¨ Restlessness. °¨ Seizures. °DIAGNOSIS °Alcohol use disorder is diagnosed through an assessment by your health care provider. Your health care provider may start by asking three or four questions to screen for excessive or problematic alcohol use. To confirm a diagnosis of alcohol use disorder, at least two symptoms must be present within a 12-month period. The severity of alcohol use disorder depends on the number of symptoms: °· Mild--two or three. °· Moderate--four or five. °· Severe--six or more. °Your health care provider may perform a physical exam or use results from lab tests to see if you have physical problems resulting from alcohol use. Your health care provider may refer you to a mental health professional for evaluation. °TREATMENT  °Some people with alcohol use disorder are able to reduce their alcohol use to low-risk levels. Some people with alcohol use disorder need to quit drinking alcohol. When necessary, mental health professionals with specialized training in substance use treatment can help. Your health care provider can help you decide how severe your alcohol use disorder is and what type of treatment you need. The following forms of treatment are available:  °· Detoxification. Detoxification involves the use of prescription medicines to prevent alcohol withdrawal symptoms in the first week after quitting. This is important for people with a history of symptoms   of withdrawal and for heavy drinkers who are likely to have withdrawal symptoms. Alcohol withdrawal can be dangerous and, in severe cases, cause  death. Detoxification is usually provided in a hospital or in-patient substance use treatment facility.  Counseling or talk therapy. Talk therapy is provided by substance use treatment counselors. It addresses the reasons people use alcohol and ways to keep them from drinking again. The goals of talk therapy are to help people with alcohol use disorder find healthy activities and ways to cope with life stress, to identify and avoid triggers for alcohol use, and to handle cravings, which can cause relapse.  Medicines.Different medicines can help treat alcohol use disorder through the following actions:  Decrease alcohol cravings.  Decrease the positive reward response felt from alcohol use.  Produce an uncomfortable physical reaction when alcohol is used (aversion therapy).  Support groups. Support groups are run by people who have quit drinking. They provide emotional support, advice, and guidance. These forms of treatment are often combined. Some people with alcohol use disorder benefit from intensive combination treatment provided by specialized substance use treatment centers. Both inpatient and outpatient treatment programs are available. Document Released: 06/06/2004 Document Revised: 09/13/2013 Document Reviewed: 08/06/2012 [facility] Patient Information 2015 [place], [place]. This information is not intended to replace advice given to you by your health care provider. Make sure you discuss any questions you have with your health care provider.  Alcohol Intoxication Alcohol intoxication occurs when you drink enough alcohol that it affects your ability to function. It can be mild or very severe. Drinking a lot of alcohol in a short time is called binge drinking. This can be very harmful. Drinking alcohol can also be more dangerous if you are taking medicines or other drugs. Some of the effects caused by alcohol may include:  Loss of coordination.  Changes in mood and behavior.  Unclear  thinking.  Trouble talking (slurred speech).  Throwing up (vomiting).  Confusion.  Slowed breathing.  Twitching and shaking (seizures).  Loss of consciousness. HOME CARE  Do not drive after drinking alcohol.  Drink enough water and fluids to keep your pee (urine) clear or pale yellow. Avoid caffeine.  Only take medicine as told by your doctor. GET HELP IF:  You throw up (vomit) many times.  You do not feel better after a few days.  You frequently have alcohol intoxication. Your doctor can help decide if you should see a substance use treatment counselor. GET HELP RIGHT AWAY IF:  You become shaky when you stop drinking.  You have twitching and shaking.  You throw up blood. It may look bright red or like coffee grounds.  You notice blood in your poop (bowel movements).  You become lightheaded or pass out (faint). MAKE SURE YOU:   Understand these instructions.  Will watch your condition.  Will get help right away if you are not doing well or get worse. Document Released: 10/16/2007 Document Revised: 12/30/2012 Document Reviewed: 10/02/2012 [facility] Patient Information 2015 [place], [place]. This information is not intended to replace advice given to you by your health care provider. Make sure you discuss any questions you have with your health care provider.  Alcohol Withdrawal Alcohol withdrawal happens when you normally drink alcohol a lot and suddenly stop drinking. Alcohol withdrawal symptoms can be mild to very bad. Mild withdrawal symptoms can include feeling sick to your stomach (nauseous), headache, or feeling irritable. Bad withdrawal symptoms can include shakiness, being very nervous (anxious), and not thinking clearly.  HOME  CARE  Join an alcohol support group.  Stay away from people or situations that make you want to drink.  Eat a healthy diet. Eat a lot of fresh fruits, vegetables, and lean meats. GET HELP RIGHT AWAY IF:   You become confused. You  start to see and hear things that are not really there.  You feel your heart beating very fast.  You throw up (vomit) blood or cannot stop throwing up. This may be bright red or look like black coffee grounds.  You have blood in your poop (stool). This may be bright red, maroon colored, or black and tarry.  You are lightheaded or pass out (faint).  You develop a fever. MAKE SURE YOU:   Understand these instructions.  Will watch your condition.  Will get help right away if you are not doing well or get worse. Document Released: 10/16/2007 Document Revised: 07/22/2011 Document Reviewed: 10/16/2007 [facility] Patient Information 2015 [place], [place]. This information is not intended to replace advice given to you by your health care provider. Make sure you discuss any questions you have with your health care provider.   Be sure to call your caregiver and arrange for follow-up care as suggested by our staff. RETURN IMMEDIATELY IF DEVELOP threat to harm self or others, suicidal or homicidal thoughts, hallucinations or confusion, unable to be cared for at home or uncontrolled behavior, or other concerns.       Behavioral Health Resources in the [facility]  Intensive Outpatient Programs: [facility]      [address] [place], [place] [phone] Both a day and evening program       [facility]     [address]        [place], [place] [address] [phone]         [facility]: [facility] [address] [place] [phone]  [facility] Mental Health ACCESS LINE: [phone] or [phone] [address] [place], [place] [address] [email or web address]  Mobile Crisis Teams:                                        [facility]         Mobile Crisis Care Unit [phone]             [facility] [address]  Ginette Otto [phone]                                         [facility] [address] [address] [place] [phone]  Self-Help/Support Groups: [facility] [employer] of support groups [phone] (call for more info)  Narcotics Anonymous (NA) [facility] [address] [place] - 2 meetings at this location  Residential Treatment Programs:  [facility]      [address]        [place]       [phone]         [facility] [address], [place] [address] [place], [place]  [phone]  [facility]  [address] [place], [place] [address] [phone] Admissions: 8am-3pm M-F  [facility]     [address]  [place], [place] [address]       [phone]         [facility] [address] [place], [place] [phone]  [facility] [address] [place], [place] [phone]  [facility] - Intensive Outpatient      [address]     [place], [place]       [phone]         [facility]     [address] [place], [place] [phone] or [phone]  [facility] ([facility])  [address] [place], [place] [phone]  [facility] [address]                                               [address] [place] [phone]  [facility] Resources: [phone]- [phone]               General Therapy                                                Angie FavaJulie Brannon, PhD        [address]                                       [place], [place] [address]         [phone]   Insurance  [facility] [place]   [address] [place], [place] [address] [phone]  [facility] [address] [place], [place] [address] [phone] Insurance/Medicaid/sponsorship through  [facility]  [facility]                                              [address]                                        [place], [place] [address]    Therapy/tele-psych/case         [phone]          [facility] [address]   , [place]  [phone]  Adolescent/group home/case management [phone]                                           Creola CornJulia Brannon PhD       General therapy       Insurance   [phone]         Dr. Lolly MustacheArfeen Insurance [id number]- [phone] M-F  [facility] Medicaid, sponsorship [phone]

## 2013-12-14 NOTE — ED Notes (Signed)
Dr. Fonnie JarvisBednar at bedside evaluating patient

## 2013-12-14 NOTE — ED Notes (Signed)
Dr. Bednar at bedside evaluating patient 

## 2014-02-14 ENCOUNTER — Emergency Department (HOSPITAL_COMMUNITY)
Admission: EM | Admit: 2014-02-14 | Discharge: 2014-02-14 | Disposition: A | Discharge status: Home / Self Care | Payer: Self-pay | Attending: Emergency Medicine | Admitting: Emergency Medicine

## 2014-02-14 ENCOUNTER — Emergency Department (HOSPITAL_COMMUNITY): Payer: Self-pay

## 2014-02-14 ENCOUNTER — Encounter (HOSPITAL_COMMUNITY): Payer: Self-pay | Admitting: Emergency Medicine

## 2014-02-14 DIAGNOSIS — M545 Low back pain, unspecified: Secondary | ICD-10-CM

## 2014-02-14 DIAGNOSIS — Z8659 Personal history of other mental and behavioral disorders: Secondary | ICD-10-CM | POA: Insufficient documentation

## 2014-02-14 DIAGNOSIS — Z7982 Long term (current) use of aspirin: Secondary | ICD-10-CM | POA: Insufficient documentation

## 2014-02-14 DIAGNOSIS — Z8619 Personal history of other infectious and parasitic diseases: Secondary | ICD-10-CM | POA: Insufficient documentation

## 2014-02-14 DIAGNOSIS — Z720 Tobacco use: Secondary | ICD-10-CM | POA: Insufficient documentation

## 2014-02-14 LAB — URINALYSIS, ROUTINE W REFLEX MICROSCOPIC
Bilirubin Urine: NEGATIVE
Glucose, UA: NEGATIVE mg/dL
Hgb urine dipstick: NEGATIVE
KETONES UR: NEGATIVE mg/dL
LEUKOCYTES UA: NEGATIVE
NITRITE: NEGATIVE
PROTEIN: NEGATIVE mg/dL
Specific Gravity, Urine: 1.01 (ref 1.005–1.030)
UROBILINOGEN UA: 0.2 mg/dL (ref 0.0–1.0)
pH: 5 (ref 5.0–8.0)

## 2014-02-14 LAB — I-STAT CHEM 8, ED
BUN: 5 mg/dL — ABNORMAL LOW (ref 6–23)
CALCIUM ION: 1.18 mmol/L (ref 1.12–1.23)
CHLORIDE: 103 meq/L (ref 96–112)
Creatinine, Ser: 0.9 mg/dL (ref 0.50–1.35)
Glucose, Bld: 109 mg/dL — ABNORMAL HIGH (ref 70–99)
HEMATOCRIT: 46 % (ref 39.0–52.0)
HEMOGLOBIN: 15.6 g/dL (ref 13.0–17.0)
Potassium: 3.9 mEq/L (ref 3.7–5.3)
Sodium: 139 mEq/L (ref 137–147)
TCO2: 27 mmol/L (ref 0–100)

## 2014-02-14 MED ORDER — CYCLOBENZAPRINE HCL 10 MG PO TABS: 10.0000 mg | ORAL_TABLET | Freq: Three times a day (TID) | ORAL | Status: DC | PRN

## 2014-02-14 MED ORDER — CYCLOBENZAPRINE HCL 10 MG PO TABS
5.0000 mg | ORAL_TABLET | Freq: Once | ORAL | Status: AC
  Administered 2014-02-14: 5 mg via ORAL
  Filled 2014-02-14: qty 1

## 2014-02-14 NOTE — Discharge Instructions (Signed)
Read the information below.  Use the prescribed medication as directed.  Please discuss all new medications with your pharmacist.  You may return to the Emergency Department at any time for worsening condition or any new symptoms that concern you.   If you develop fevers, loss of control of bowel or bladder, weakness or numbness in your legs, or are unable to walk, return to the ER for a recheck.  ° °Back Pain, Adult °Back pain is very common. The pain often gets better over time. The cause of back pain is usually not dangerous. Most people can learn to manage their back pain on their own.  °HOME CARE  °· Stay active. Start with short walks on flat ground if you can. Try to walk farther each day. °· Do not sit, drive, or stand in one place for more than 30 minutes. Do not stay in bed. °· Do not avoid exercise or work. Activity can help your back heal faster. °· Be careful when you bend or lift an object. Bend at your knees, keep the object close to you, and do not twist. °· Sleep on a firm mattress. Lie on your side, and bend your knees. If you lie on your back, put a pillow under your knees. °· Only take medicines as told by your doctor. °· Put ice on the injured area. °¨ Put ice in a plastic bag. °¨ Place a towel between your skin and the bag. °¨ Leave the ice on for 15-20 minutes, 03-04 times a day for the first 2 to 3 days. After that, you can switch between ice and heat packs. °· Ask your doctor about back exercises or massage. °· Avoid feeling anxious or stressed. Find good ways to deal with stress, such as exercise. °GET HELP RIGHT AWAY IF:  °· Your pain does not go away with rest or medicine. °· Your pain does not go away in 1 week. °· You have new problems. °· You do not feel well. °· The pain spreads into your legs. °· You cannot control when you poop (bowel movement) or pee (urinate). °· Your arms or legs feel weak or lose feeling (numbness). °· You feel sick to your stomach (nauseous) or throw up  (vomit). °· You have belly (abdominal) pain. °· You feel like you may pass out (faint). °MAKE SURE YOU:  °· Understand these instructions. °· Will watch your condition. °· Will get help right away if you are not doing well or get worse. °Document Released: 10/16/2007 Document Revised: 07/22/2011 Document Reviewed: 08/31/2013 °[employer]® Patient Information ©2015 [employer]. This information is not intended to replace advice given to you by your health care provider. Make sure you discuss any questions you have with your health care provider. ° °

## 2014-02-14 NOTE — ED Notes (Signed)
Pt coming from home with c/o of left lower back pain that has been ongoing x 2 weeks.  Pt denies fever, no N/V/D.  Pt denies blood in the urine or pain during urination.

## 2014-02-14 NOTE — ED Provider Notes (Signed)
CSN: [id number]     Arrival date & time 02/14/14  1000 History   First MD Initiated Contact with Patient 02/14/14 1014     Chief Complaint  Patient presents with  . Back Pain    Lower left     (Consider location/radiation/quality/duration/timing/severity/associated sxs/prior Treatment) HPI  Patient presents with left lower back pain that has been ongoing for weeks to months.  States the pain is intermittent, described as aching, worse with movement and better with rest.  Pain currently 10/10 intensity.  It does not radiate.  Has taken nothing for the pain.  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  Family member in room states she thinks this may have exacerbated his pain.  Reports he went to work today and started trying to mop the floor and decided the pain was too much and he needed to come in. He had dysuria a few months ago but this resolved.  Had URI with fever last week but this has resolved.  Denies recent fevers, CP, SOB, cough.  Denies fevers, chills, abdominal pain, loss of control of bowel or bladder, weakness of numbness of the extremities, saddle anesthesia, bowel, or urinary complaints.  Per family member, pt currently only drinking on the weekends.     Past Medical History  Diagnosis Date  . Hepatitis C   . Depression   . Schizophrenia   . Depression   . Anxiety   . Alcohol abuse   . Bipolar 1 disorder   . Suicide attempt by drug ingestion    History reviewed. No pertinent past surgical history. No family history on file. History  Substance Use Topics  . Smoking status: Current Every Day Smoker -- 0.50 packs/day  . Smokeless tobacco: Not on file  . Alcohol Use: Yes     Comment: regular    Review of Systems  All other systems reviewed and are negative.     Allergies  Review of patient's allergies indicates no known allergies.  Home Medications   Prior to Admission medications   Medication Sig Start  Date End Date Taking? Authorizing Provider  acetaminophen (TYLENOL) 500 MG tablet Take 1,000 mg by mouth every 6 (six) hours as needed for moderate pain or headache.   Yes Historical Provider, MD  Aspirin-Acetaminophen-Caffeine (GOODY HEADACHE PO) Take 1 Package by mouth daily as needed (for headache).   Yes Historical Provider, MD  ibuprofen (ADVIL,MOTRIN) 200 MG tablet Take 400 mg by mouth every 6 (six) hours as needed for headache or moderate pain.   Yes Historical Provider, MD   BP 136/89  Pulse 57  Temp(Src) 97.5 F (36.4 C) (Oral)  Resp 26  SpO2 100% Physical Exam  Nursing note and vitals reviewed. Constitutional: He appears well-developed and well-nourished. No distress.  HENT:  Head: Normocephalic and atraumatic.  Neck: Neck supple.  Cardiovascular: Normal rate and regular rhythm.   Pulmonary/Chest: Effort normal and breath sounds normal. No respiratory distress. He has no wheezes. He has no rales.  Abdominal: Soft. He exhibits no distension and no mass. There is no tenderness. There is no rebound, no guarding and no CVA tenderness.  Spine nontender, no crepitus, or stepoffs.  Lower extremities:  Strength 5/5, sensation intact, distal pulses intact.     Neurological: He is alert. He exhibits normal muscle tone. Gait normal.  Skin: He is not diaphoretic.    ED Course  Procedures (including critical care time) Labs Review  Labs Reviewed  I-STAT CHEM 8, ED - Abnormal; Notable for the following:    BUN 5 (*)    Glucose, Bld 109 (*)    All other components within normal limits  URINALYSIS, ROUTINE W REFLEX MICROSCOPIC    Imaging Review Dg Lumbar Spine Complete  02/14/2014   CLINICAL DATA:  Left lower back pain.  Two week duration.  EXAM: LUMBAR SPINE - COMPLETE 4+ VIEW  COMPARISON:  None.  FINDINGS: Paraspinal soft tissues are normal. Diffuse degenerative changes noted of the lumbar spine. No acute bony abnormality identified. Aortoiliac atherosclerotic vascular disease.   IMPRESSION: Diffuse degenerative changes lumbar spine, no acute abnormality.   Electronically Signed   By: Maisie Fus  Register   On: 02/14/2014 11:59     EKG Interpretation None      MDM   Final diagnoses:  Left-sided low back pain without sciatica    Afebrile, nontoxic patient with left sided low back pain, worse with movement.  No red flags for back pain.   D/C home with flexeril, PCP resources for follow up.  Discussed result, findings, treatment, and follow up  with patient.  Pt given return precautions.  Pt verbalizes understanding and agrees with plan.         Trixie Dredge, PA-C 02/14/14 1241

## 2014-02-14 NOTE — ED Provider Notes (Signed)
Medical screening examination/treatment/procedure(s) were performed by non-physician practitioner and as supervising physician I was immediately available for consultation/collaboration.     Suzi RootsKevin E Casy Brunetto, MD 02/14/14 [phone]

## 2014-02-18 NOTE — Discharge Planning (Signed)
[facility] Community [facility] & Eligibility Specialist ° °Spoke to patient regarding primary care resources and establishing care with a provider. Orange card application and instructions provided. Resource guide and my contact information also given for any future questions or concerns. No other needs identified. ° °
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
## 2014-08-30 NOTE — Consult Note (Signed)
PATIENT NAME:  Alfred Jackson, Alfred Jackson MR#:  [id number] DATE OF BIRTH:  [birth year]  DATE OF CONSULTATION:  03/17/2012  REFERRING PHYSICIAN:  Lowella Fairy, MD   CONSULTING PHYSICIAN:  Jolanta B. Pucilowska, MD  REASON FOR CONSULTATION: To evaluate a patient with alcoholism.   IDENTIFYING DATA: Alfred Jackson is a 54 year old male with history of alcoholism.   CHIEF COMPLAINT: "I want to go home."   HISTORY OF PRESENT ILLNESS: Alfred Jackson has a history of drinking but has not been drinking daily for the past 1 or 2 months as his income is low and he cannot afford daily binges. He does admit to a long history of alcoholism. On the night of admission he had several beers, got drunk, and per his wife's report was making suicidal threats including holding a gun which he did not aim at himself or threaten others. The patient denies that he owns a gun and reports that it was a BB gun that did not even have the compressed air capsule. From the beginning since he was brought to the Emergency Room, the patient denied any thoughts of hurting himself or others. He was pretty goofy, laughing inappropriately all night long in the Emergency Room, and really unwilling to provide much of the information. He is sober, he is cool and collected now. He denies any symptoms of depression or anxiety. There are no psychotic symptoms. There are no symptoms suggestive of bipolar mania. He admits to occasional alcohol drinking but uses marijuana regularly. He denies other illicit substance use.   PAST PSYCHIATRIC HISTORY: He overdosed on medication twice and was hospitalized at [facility] and [facility].  He was tried on antidepressants, but they gave him unacceptable side effects and he is no longer interested in taking medications. He reports only a period of sobriety of about a year when he was on probation. He denies that he ever participated in a substance abuse treatment program.   FAMILY PSYCHIATRIC HISTORY: Father with history of  alcoholism but is sober now.   PAST MEDICAL HISTORY:  1. Hepatitis C. 2. Kidney problems.   ALLERGIES: No known drug allergies.  MEDICATIONS ON ADMISSION: None.   SOCIAL HISTORY: He lives with his girlfriend of 20 years. He has two daughters, ages 2 and 83. He is unemployed but works for his boss whenever a job is available irregularly. 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 He has no health insurance.   REVIEW OF SYSTEMS: CONSTITUTIONAL: No fevers or chills. No weight changes. EYES: No double or blurred vision. ENT: No hearing loss. RESPIRATORY: No shortness of breath or cough. CARDIOVASCULAR: No chest pain or orthopnea. GASTROINTESTINAL: No abdominal pain, nausea, vomiting, or diarrhea. GU: No incontinence or frequency. ENDOCRINE: No heat or cold intolerance. LYMPHATIC: No anemia or easy bruising. INTEGUMENTARY: No acne or rash. MUSCULOSKELETAL: No muscle or joint pain. NEUROLOGIC: No tingling or weakness. PSYCHIATRIC: See history of present illness for details.   PHYSICAL EXAMINATION:  VITAL SIGNS: Blood pressure is 135/83, pulse 79, respirations 20, temperature 96.   GENERAL: This is a well-developed male in no acute distress. The rest of the physical examination is deferred to his primary attending.   LABORATORY DATA: Chemistries are within normal limits. Blood alcohol level on admission is 0.228. LFTs are within normal limits. TSH is 1.61. Urine tox screen is positive for marijuana. CBC within normal limits. Serum acetaminophen less than 2, serum salicylates 3.8.   MENTAL STATUS EXAMINATION:  The patient is alert and oriented to person, place, time, and situation. He is pleasant, polite, and cooperative. He is sitting in bed wearing hospital scrubs and a yellow shirt. He maintains good eye contact. His speech is of normal rhythm, rate, and volume. Mood is fine with full affect. Thought processing is logical and goal oriented. Thought content: He  denies suicidal or homicidal ideation. There are no delusions or paranoia. There are no auditory or visual hallucinations. His cognition is grossly intact. He registers three out of three and recalls three out of three objects after five minutes. He can spell world forward and backward. He knows the current president. His insight and judgment are questionable.   SUICIDE RISK ASSESSMENT: This is a patient with a lifelong history of alcoholism and two suicide attempts in the past, who is also abusing marijuana, who was brought to the hospital after acting strange while intoxicated. The patient denies any thoughts or intention to hurt himself or others. He is able to contract for safety.   DIAGNOSES:  AXIS I:  1. Alcohol dependence.  2. Alcohol intoxication. 3. Marijuana abuse.   AXIS II: Deferred.   AXIS III:  1. Hepatitis.  2. Kidney problems.   AXIS IV: Substance abuse, family conflict.   AXIS V: Global Assessment of Functioning score is 45.   PLAN:  1. The patient does not meet criteria for involuntary inpatient psychiatric commitment. Please discharge as appropriate.  2. Mood: In spite of his wife's report, the patient denies symptoms of depression or anxiety. He denies that he felt suicidal last night. He is not homicidal. He is able to contract for safety.  3. Substance abuse: The patient is not interested in substance abuse treatment.  4. His family will visit this afternoon. They will provide transportation.  5. No appointment is recommended at this point.  ____________________________ Ellin GoodieJolanta B. Jennet MaduroPucilowska, MD jbp:cbb D: 03/17/2012 15:07:45 ET T: 03/17/2012 17:16:40 ET JOB#: [id number]  cc: Jolanta B. Jennet MaduroPucilowska, MD, <Dictator> Shari ProwsJOLANTA B PUCILOWSKA MD ELECTRONICALLY SIGNED 03/19/2012 17:31

## 2014-08-30 NOTE — Consult Note (Signed)
Brief Consult Note: Diagnosis: Alcohol dependence, Alcohol intoxication, substance-induced mood disorder.   Patient was seen by consultant.   Consult note dictated.   Recommend further assessment or treatment.   Orders entered.   Discussed with Attending MD.   Comments: [name] has a h/o alcoholism and depression. He got drunk last night and behaved strangely. He was, per zGF report holding a gun w/o threateni ng anyone. The patient adamantly denies owning a gun, rather a bb gun. He has not expressed SI opr HI during his stay in the ER. He is no longer drunk. He is cool and collected. he is able to CFS.  PLAN: 1. The patient does not meet criteria for IVC. Please discharge as appropriAte.   2. The patient declines substance abuse treatment.   3. The patient denies symptoms of depression and is not interested in pharmacotherapy.  4. His GF and daughter are coming to visit today. They will provide transportation to home.  Electronic Signatures: Alfred Jackson, Alfred Jackson (MD)  (Signed [id number]-Nov-13 12:22)  Authored: Brief Consult Note   Last Updated: 05-Nov-13 12:22 by Alfred Jackson, Daveigh Batty (MD)

## 2014-09-02 NOTE — Consult Note (Signed)
PATIENT NAME:  Alfred Jackson, Alfred Jackson MR#:  [id number] DATE OF BIRTH:  [birth year]  DATE OF CONSULTATION:  09/24/2012  REFERRING PHYSICIAN:  Enedina Finner. Manson Passey, MD CONSULTING PHYSICIAN:  Ardeen Fillers. Garnetta Buddy, MD  REASON FOR CONSULTATION: Alcohol use.   HISTORY OF PRESENT ILLNESS: The patient is a 54 year old Caucasian male who presented to the ED with the police after his girlfriend called the report that he was going to walk to the church at night. The patient reported that he went to the pastor, and he was walking by himself to talk to the [place]. The girlfriend was very upset, and she stated that she is going to admit him to the hospital. He reported that she drinks more than him. The patient reported that he has slowed down after he had the incident in which he fell and hurt himself a couple of weeks ago. He reported that he loves himself and he is not going to harm himself in any way. The patient reported that he went to [facility] and got the stitches and completed the course of antibiotic. He reported that his girlfriend is crazy, and they both drink, but he has slowed down since the incident a couple of weeks ago. The patient reported that he does not have any withdrawal symptoms at this time. He remains pleasant and cooperative. He reported that he is not having any auditory or visual hallucinations. He reported that the girlfriend was very upset because he has slowed down on his drinking at this time. He reported that he does not need anything to control his behavior or depression or anxiety, as he has taken medications in the past, including Zoloft, which was making him more upset and angry. He reported that he is going to talk to the [place], which was really helpful. He denied having any withdrawal symptoms at this time.   PAST PSYCHIATRIC HISTORY: The patient reported history of 2 suicide attempts in the past, but it was a long time ago. He reported that he has tried using Zoloft, but it was not  helpful. He reported that he is not interested in using any other psychotropic medications at this time.   SUBSTANCE ABUSE HISTORY: The patient started using alcohol since his teenage years. He reported that he was using alcohol on a daily basis, but he has slowed down for the past 2 weeks. He has a history of blackouts and seizures in the past. He also admits to using marijuana since he was a teenager, but he has not been using at this time.   FAMILY HISTORY: The patient reported a long history of alcoholism in his family, including on his maternal and father's side.   ALLERGIES: No known drug allergies.   CURRENT MEDICATIONS: None.   SOCIAL HISTORY: The patient currently lives with his girlfriend. He reported that he has a rocky relationship with her since he has slowed down on his drinking.   VITAL SIGNS: Temperature 97.4, pulse 73, respirations 18, blood pressure 120/71.  LABORATORY DATA: Sodium 139, potassium 3.6, chloride 108, bicarbonate 25, anion gap 6, osmolality 277, calcium 8.8. Blood alcohol level 125. Glucose 104, BUN 9, creatinine 0.87, protein 7.6, albumin 3.7, bilirubin 0.3, alkaline phosphatase 111, AST 27, ALT 47. TSH 2.58. WBC 7.9, RBC 4.36, hemoglobin 13.9, hematocrit 40.6, platelet count 415, MCV 93, MCH 31.9, MCHC 34.2, RDW 13.6.   REVIEW OF SYSTEMS:  CONSTITUTIONAL: Denies having any fever, fatigue, weakness or pain.  EYES: Denies any blurred or double vision.  ENT:  Denies having any ear pain or hearing loss.  RESPIRATORY: No cough, wheezing or hemoptysis noted.  CARDIOVASCULAR: Denies any chest pain or orthopnea.  GASTROINTESTINAL: No nausea, vomiting or diarrhea noted.  GENITOURINARY: No dysuria or hematuria noted.  ENDOCRINE: No polydipsia or nocturia noted.  INTEGUMENTARY: No acne or rash noted.  NEUROLOGICAL: No numbness or weakness noted.   MENTAL STATUS EXAMINATION: The patient is a moderately built male who was pleasant and cooperative during the interview.  He maintained fair eye contact. His speech was normal in tone and volume. Mood was somewhat anxious, and face was flushed, which he reported, as he is talking to me, he is feeling somewhat anxious during this time. His mood was anxious. Affect was congruent. Thought process was logical, goal-directed. Thought content was non-delusional. He currently denied having any suicidal or homicidal ideations or plans.   DIAGNOSTIC IMPRESSION:  AXIS I:  1. Alcohol dependence.  2. Alcohol-induced mood disorder.  AXIS II: None.  AXIS III: Hypertension.   TREATMENT PLAN: Discussed with the patient at length about his use of alcohol, and he reported that he has slowed down. He is not experiencing any withdrawal from alcohol, and he denied having any suicidal ideations or plans. I also discussed his case at length with the ED physician and the nursing staff, and they agreed that the patient can be discharged in stable condition. He does not need any medications, and he came in on a voluntary basis. He will be discharged in a stable condition. Advised him to call back or come to the Emergency Room if he needs help. He demonstrated understanding. The patient will be discharged from the ED at this time.   Thank you for allowing me to participate in the care of this patient.   ____________________________ Ardeen FillersUzma S. Garnetta BuddyFaheem, MD usf:OSi D: 09/24/2012 11:53:14 ET T: 09/24/2012 12:32:54 ET JOB#: [id number]  cc: Ardeen FillersUzma S. Garnetta BuddyFaheem, MD, <Dictator> Rhunette CroftUZMA S Ziah Turvey MD ELECTRONICALLY SIGNED 09/29/2012 16:38

## 2016-05-24 ENCOUNTER — Encounter: Payer: Self-pay | Admitting: Emergency Medicine

## 2016-05-24 ENCOUNTER — Emergency Department: Payer: Self-pay

## 2016-05-24 ENCOUNTER — Emergency Department
Admission: EM | Admit: 2016-05-24 | Discharge: 2016-05-24 | Disposition: A | Discharge status: Home / Self Care | Payer: Self-pay | Attending: Emergency Medicine | Admitting: Emergency Medicine

## 2016-05-24 DIAGNOSIS — Y939 Activity, unspecified: Secondary | ICD-10-CM | POA: Insufficient documentation

## 2016-05-24 DIAGNOSIS — F17200 Nicotine dependence, unspecified, uncomplicated: Secondary | ICD-10-CM | POA: Insufficient documentation

## 2016-05-24 DIAGNOSIS — Z7982 Long term (current) use of aspirin: Secondary | ICD-10-CM | POA: Insufficient documentation

## 2016-05-24 DIAGNOSIS — W230XXA Caught, crushed, jammed, or pinched between moving objects, initial encounter: Secondary | ICD-10-CM | POA: Insufficient documentation

## 2016-05-24 DIAGNOSIS — Y929 Unspecified place or not applicable: Secondary | ICD-10-CM | POA: Insufficient documentation

## 2016-05-24 DIAGNOSIS — Y999 Unspecified external cause status: Secondary | ICD-10-CM | POA: Insufficient documentation

## 2016-05-24 DIAGNOSIS — S61216A Laceration without foreign body of right little finger without damage to nail, initial encounter: Secondary | ICD-10-CM | POA: Insufficient documentation

## 2016-05-24 DIAGNOSIS — Z23 Encounter for immunization: Secondary | ICD-10-CM | POA: Insufficient documentation

## 2016-05-24 DIAGNOSIS — S67196A Crushing injury of right little finger, initial encounter: Secondary | ICD-10-CM | POA: Insufficient documentation

## 2016-05-24 MED ORDER — BACITRACIN ZINC 500 UNIT/GM EX OINT
TOPICAL_OINTMENT | CUTANEOUS | Status: AC
  Filled 2016-05-24: qty 0.9

## 2016-05-24 MED ORDER — HYDROCODONE-ACETAMINOPHEN 5-325 MG PO TABS: 1.0000 | ORAL_TABLET | Freq: Four times a day (QID) | ORAL | 0 refills | Status: DC | PRN

## 2016-05-24 MED ORDER — TETANUS-DIPHTH-ACELL PERTUSSIS 5-2.5-18.5 LF-MCG/0.5 IM SUSP
0.5000 mL | Freq: Once | INTRAMUSCULAR | Status: AC
  Administered 2016-05-24: 0.5 mL via INTRAMUSCULAR
  Filled 2016-05-24: qty 0.5

## 2016-05-24 MED ORDER — BACITRACIN ZINC 500 UNIT/GM EX OINT
TOPICAL_OINTMENT | Freq: Two times a day (BID) | CUTANEOUS | Status: DC
  Administered 2016-05-24: 1 via TOPICAL

## 2016-05-24 MED ORDER — CLINDAMYCIN HCL 300 MG PO CAPS: 300.0000 mg | ORAL_CAPSULE | Freq: Three times a day (TID) | ORAL | 0 refills | Status: DC

## 2016-05-24 MED ORDER — IBUPROFEN 800 MG PO TABS: 800.0000 mg | ORAL_TABLET | Freq: Three times a day (TID) | ORAL | 0 refills | Status: DC | PRN

## 2016-05-24 NOTE — ED Provider Notes (Signed)
[facility] Emergency Department Provider Note  ____________________________________________  Time seen: Approximately 9:54 AM  I have reviewed the triage vital signs and the nursing notes.   HISTORY  Chief Complaint Laceration    HPI Alfred Jackson is a 56 y.o. male, NAD, presents to the emergency department for evaluation of right 5th finger lacerations. States that yesterday at 2pm, a 6"x6" landed on his finger, crushing it against the tailgate of a truck.  He cleaned the wound, applied triple antibiotic ointment, and wrapped it with a bandage. Today, he woke with increasing pain and swelling about the injured finger. Has not noted any oozing, weeping or further bleeding. He has not taking anything for the pain, and cites that moving the finger exacerbates the pain. He is uncertain of last tetanus vaccination.    Past Medical History:  Diagnosis Date  . Alcohol abuse   . Anxiety   . Bipolar 1 disorder (HCC)   . Depression   . Depression   . Hepatitis C   . Schizophrenia (HCC)   . Suicide attempt by drug ingestion [facility](HCC)     Patient Active Problem List   Diagnosis Date Noted  . Suicide attempt by drug ingestion (HCC)   . Schizophrenia (HCC) 12/28/2012  . Psychosis 12/28/2012    History reviewed. No pertinent surgical history.  Prior to Admission medications   Medication Sig Start Date End Date Taking? Authorizing Provider  acetaminophen (TYLENOL) 500 MG tablet Take 1,000 mg by mouth every 6 (six) hours as needed for moderate pain or headache.    Historical Provider, MD  Aspirin-Acetaminophen-Caffeine (GOODY HEADACHE PO) Take 1 Package by mouth daily as needed (for headache).    Historical Provider, MD  clindamycin (CLEOCIN) 300 MG capsule Take 1 capsule (300 mg total) by mouth 3 (three) times daily. 05/24/16   Noris Kulinski L Meika Earll, PA-C  cyclobenzaprine (FLEXERIL) 10 MG tablet Take 1 tablet (10 mg total) by mouth 3 (three) times daily as needed for muscle  spasms (or pain). 02/14/14   Trixie DredgeEmily West, PA-C  HYDROcodone-acetaminophen (NORCO) 5-325 MG tablet Take 1 tablet by mouth every 6 (six) hours as needed for severe pain. 05/24/16   Remas Sobel L Nichoals Heyde, PA-C  ibuprofen (ADVIL,MOTRIN) 800 MG tablet Take 1 tablet (800 mg total) by mouth every 8 (eight) hours as needed (pain). 05/24/16   Deb Loudin L Darius Lundberg, PA-C    Allergies Patient has no known allergies.  History reviewed. No pertinent family history.  Social History Social History  Substance Use Topics  . Smoking status: Current Every Day Smoker    Packs/day: 0.50  . Smokeless tobacco: Never Used  . Alcohol use Yes     Comment: regular     Review of Systems  Constitutional: No fever/chills Musculoskeletal: Positive for Right fifth finger pain. No hand or wrist pain. Skin: Positive lacerations right fifth finger with swelling. Negative for rash, redness, abnormal warmth, active bleeding, oozing, weeping. Neurological: Negative for numbness, weakness, tingling.  ____________________________________________   PHYSICAL EXAM:  VITAL SIGNS: ED Triage Vitals [05/24/16 0841]  Enc Vitals Group     BP (!) 148/82     Pulse Rate 76     Resp 20     Temp 97.8 F (36.6 C)     Temp Source Oral     SpO2 100 %     Weight 175 lb (79.4 kg)     Height      Head Circumference      Peak Flow  Pain Score 7     Pain Loc      Pain Edu?      Excl. in GC?      Constitutional: Alert and oriented. Well appearing and in no acute distress. Eyes: Conjunctivae are normal.  Head: Atraumatic. Cardiovascular: Good peripheral circulation with 2+ pulses noted in the right upper extremity. Capillary refill is brisk in all digits of the right hand.  Respiratory: Normal respiratory effort without tachypnea or retractions.  Musculoskeletal: Tenderness to palpation about the proximal right fifth finger at the site of lacerations. No bony abnormalities or joint tenderness. Full range of motion of the right fifth  finger. Strength of the right fifth finger is 5 out of 5. Neurologic:  Normal speech and language. No gross focal neurologic deficits are appreciated. Sensation to light touch grossly intact about the right upper extremity. Skin:  1cm superficial laceration to the lateral portion of the proximal right finger without active oozing, weeping or bleeding. 1cm scabbed over and superficial laceration to the medial portion of the proximal right fifth finger without active oozing, weeping or bleeding. No erythema, abnormal warmth, streaking, induration or fluctuance is noted about the area. Mild hyperpigmentation of the skin is noted when the patient had placed a bandage. Otherwise, skin is warm, dry and intact. No rash noted. Psychiatric: Mood and affect are normal. Speech and behavior are normal. Patient exhibits appropriate insight and judgement.   ____________________________________________   LABS  None ____________________________________________  EKG  None ____________________________________________  RADIOLOGY I, Hope Pigeon, personally viewed and evaluated these images (plain radiographs) as part of my medical decision making, as well as reviewing the written report by the radiologist.  Dg Finger Little Right  Result Date: 05/24/2016 CLINICAL DATA:  Pt got his right 5th finger caught in between some wood while moving it yesterday. Obvious laceration to the middle of his finger. No previous injury. EXAM: RIGHT LITTLE FINGER 2+V COMPARISON:  None. FINDINGS: There are 2 small triangular-shaped well corticated bone fragments along the palmar aspect of the PIP joint of the right fifth finger. These may reflect sesamoids or chronic avulsion fractures. They do not appear acute. No other evidence of a fracture. The joints are normally spaced and aligned. There is proximal to mid finger soft tissue swelling. No radiopaque foreign body. IMPRESSION: 1. No convincing acute fracture.  No dislocation. 2.  No radiopaque foreign body. Electronically Signed   By: Amie Portland M.D.   On: 05/24/2016 10:45    ____________________________________________    PROCEDURES  Procedure(s) performed: None   Procedures   Medications  Tdap (BOOSTRIX) injection 0.5 mL (0.5 mLs Intramuscular Given 05/24/16 1126)    ____________________________________________   INITIAL IMPRESSION / ASSESSMENT AND PLAN / ED COURSE  Pertinent labs & imaging results that were available during my care of the patient were reviewed by me and considered in my medical decision making (see chart for details).  Clinical Course     Patient's diagnosis is consistent with Crush injury of the right fifth finger causing laceration without foreign body without damage to the nail. Wound was thoroughly cleansed and antibiotic ointment applied and a bandage. Patient's tetanus vaccination was updated in the emergency department. Patient will be discharged home with prescriptions for clindamycin and Norco to take as directed. Patient is also take ibuprofen to decrease pain and swelling. Patient is to follow up with [facility] community clinic or [place] [facility] in 48 hours for wound recheck if symptoms persist past this treatment course.  Patient is given ED precautions to return to the ED for any worsening or new symptoms.    ____________________________________________  FINAL CLINICAL IMPRESSION(S) / ED DIAGNOSES  Final diagnoses:  Crushing injury of right little finger, initial encounter  Laceration of right little finger without foreign body without damage to nail, initial encounter      NEW MEDICATIONS STARTED DURING THIS VISIT:  Discharge Medication List as of 05/24/2016 11:48 AM    START taking these medications   Details  clindamycin (CLEOCIN) 300 MG capsule Take 1 capsule (300 mg total) by mouth 3 (three) times daily., Starting Fri 05/24/2016, Print    HYDROcodone-acetaminophen (NORCO) 5-325 MG tablet Take 1  tablet by mouth every 6 (six) hours as needed for severe pain., Starting Fri 05/24/2016, Print             Ernestene Kiel [place], PA-C 05/24/16 1857    Emily Filbert, MD 05/25/16 1501

## 2016-05-24 NOTE — ED Triage Notes (Signed)
Pt to ed with c/o right hand fifth digit laceration.  Pt states he got it caught under a piece of wood.

## 2016-10-25 ENCOUNTER — Emergency Department: Payer: Self-pay

## 2016-10-25 ENCOUNTER — Emergency Department
Admission: EM | Admit: 2016-10-25 | Discharge: 2016-10-25 | Disposition: A | Discharge status: Home / Self Care | Payer: Self-pay | Attending: Emergency Medicine | Admitting: Emergency Medicine

## 2016-10-25 DIAGNOSIS — Y9389 Activity, other specified: Secondary | ICD-10-CM | POA: Insufficient documentation

## 2016-10-25 DIAGNOSIS — Y999 Unspecified external cause status: Secondary | ICD-10-CM | POA: Insufficient documentation

## 2016-10-25 DIAGNOSIS — W228XXA Striking against or struck by other objects, initial encounter: Secondary | ICD-10-CM | POA: Insufficient documentation

## 2016-10-25 DIAGNOSIS — Z79899 Other long term (current) drug therapy: Secondary | ICD-10-CM | POA: Insufficient documentation

## 2016-10-25 DIAGNOSIS — S82891A Other fracture of right lower leg, initial encounter for closed fracture: Secondary | ICD-10-CM | POA: Insufficient documentation

## 2016-10-25 DIAGNOSIS — Y929 Unspecified place or not applicable: Secondary | ICD-10-CM | POA: Insufficient documentation

## 2016-10-25 DIAGNOSIS — F17200 Nicotine dependence, unspecified, uncomplicated: Secondary | ICD-10-CM | POA: Insufficient documentation

## 2016-10-25 MED ORDER — OXYCODONE-ACETAMINOPHEN 5-325 MG PO TABS
1.0000 | ORAL_TABLET | Freq: Once | ORAL | Status: AC
  Administered 2016-10-25: 1 via ORAL
  Filled 2016-10-25: qty 1

## 2016-10-25 MED ORDER — IBUPROFEN 600 MG PO TABS
600.0000 mg | ORAL_TABLET | Freq: Once | ORAL | Status: AC
  Administered 2016-10-25: 600 mg via ORAL
  Filled 2016-10-25: qty 1

## 2016-10-25 MED ORDER — OXYCODONE-ACETAMINOPHEN 7.5-325 MG PO TABS: 1.0000 | ORAL_TABLET | Freq: Four times a day (QID) | ORAL | 0 refills | Status: DC | PRN

## 2016-10-25 MED ORDER — NAPROXEN 500 MG PO TABS: 500.0000 mg | ORAL_TABLET | Freq: Two times a day (BID) | ORAL | Status: DC

## 2016-10-25 NOTE — Discharge Instructions (Signed)
Wear splint and follow up with orthopedics as directed. Use crutches for ambulation.

## 2016-10-25 NOTE — ED Triage Notes (Signed)
Pt states he injured his right ankle on Tuesday and is having pain and swelling since.
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
## 2021-08-02 ENCOUNTER — Encounter: Payer: Self-pay | Admitting: Emergency Medicine

## 2021-08-02 ENCOUNTER — Other Ambulatory Visit: Payer: Self-pay

## 2021-08-02 ENCOUNTER — Emergency Department
Admission: EM | Admit: 2021-08-02 | Discharge: 2021-08-02 | Disposition: A | Discharge status: Home / Self Care | Payer: Self-pay | Attending: Emergency Medicine | Admitting: Emergency Medicine

## 2021-08-02 DIAGNOSIS — K0889 Other specified disorders of teeth and supporting structures: Secondary | ICD-10-CM | POA: Insufficient documentation

## 2021-08-02 MED ORDER — TRAMADOL HCL 50 MG PO TABS: 50.0000 mg | ORAL_TABLET | Freq: Four times a day (QID) | ORAL | 0 refills | Status: AC | PRN

## 2021-08-02 MED ORDER — PENICILLIN V POTASSIUM 500 MG PO TABS: 500.0000 mg | ORAL_TABLET | Freq: Four times a day (QID) | ORAL | 2 refills | Status: AC

## 2021-08-02 NOTE — ED Triage Notes (Signed)
Pt here with dental pain for a few days. Pt states his teeth in the front and on the left upper side are causing him pain. Pt stable to triage. ?

## 2021-08-02 NOTE — ED Provider Notes (Signed)
? ?  [facility] ?Provider Note ? ? ? Event Date/Time  ? First MD Initiated Contact with Patient 08/02/21 1216   ?  (approximate) ? ? ?History  ? ?Dental Pain ? ? ?HPI ? ?Alfred Jackson is a 61 y.o. male  who per outside hospital note dated 02/14/14 has history of hep C, psychiatric illness, who presents to the emergency department today because of concern for dental pain. Worse on the left side. Current episode has been ongoing for the past few days. Patient states he has history of poor dentition and has had issues with dental pain in the past. Due to financial constraint he has not been able to follow up with a dentist. Denies any trauma to the mouth. Denies any fevers.   ? ? ?Physical Exam  ? ?Triage Vital Signs: ?ED Triage Vitals [08/02/21 1201]  ?Enc Vitals Group  ?   BP 133/83  ?   Pulse Rate 71  ?   Resp 16  ?   Temp 97.8 ?F (36.6 ?C)  ?   Temp Source Oral  ?   SpO2 98 %  ?   Weight 175 lb (79.4 kg)  ?   Height 5\' 11"  (1.803 m)  ?   Head Circumference   ?   Peak Flow   ?   Pain Score 10  ? ?Most recent vital signs: ?Vitals:  ? 08/02/21 1201  ?BP: 133/83  ?Pulse: 71  ?Resp: 16  ?Temp: 97.8 ?F (36.6 ?C)  ?SpO2: 98%  ? ?General: Awake, no distress.  ?CV:  Good peripheral perfusion.  ?Resp:  Normal effort.  ?Abd:  No distention.  ?Mouth:  Poor dentition, missing many teeth, other teeth broken. ? ? ?ED Results / Procedures / Treatments  ? ?Labs ?(all labs ordered are listed, but only abnormal results are displayed) ?Labs Reviewed - No data to display ? ? ?EKG ? ?None ? ? ?RADIOLOGY ?None ? ? ?PROCEDURES: ? ?Critical Care performed: No ? ?Procedures ? ? ?MEDICATIONS ORDERED IN ED: ?Medications - No data to display ? ? ?IMPRESSION / MDM / ASSESSMENT AND PLAN / ED COURSE  ?I reviewed the triage vital signs and the nursing notes. ?             ?               ? ?Differential diagnosis includes, but is not limited to, dental infection, exposed root. ? ?Patient presents to the emergency department  because of concern for dental pain. On exam patient with poor dentition. No clinical signs of systemic infection. Will treat with abx and pain medication. Will give patient dental follow up information. ? ? ?FINAL CLINICAL IMPRESSION(S) / ED DIAGNOSES  ? ?Final diagnoses:  ?Pain, dental  ? ? ? ?Rx / DC Orders  ? ?ED Discharge Orders   ? ?      Ordered  ?  penicillin v potassium (VEETID) 500 MG tablet  4 times daily       ? 08/02/21 1308  ?  traMADol (ULTRAM) 50 MG tablet  Every 6 hours PRN       ? 08/02/21 1308  ? ?  ?  ? ?  ? ? ? ?Note:  This document was prepared using Dragon voice recognition software and may include unintentional dictation errors. ? ?  ?Nance Pear, MD ?08/02/21 1316 ? ?

## 2021-08-02 NOTE — Discharge Instructions (Signed)
OPTIONS FOR DENTAL FOLLOW UP CARE ° °[place] Department of Health and Human Services - Local Safety Net Dental Clinics °http://www.ncdhhs.gov/dph/oralhealth/services/safetynetclinics.htm °  °[facility] (336-562-3123) ° °[facility] [place] (919-933-9087) ° °[facility] ([phone] ext [phone]) ° °[facility] ([phone]) ° °[facility] ([phone]) °This clinic caters to the indigent population and is on a lottery system. °Location: °[facility], [address], [place] °Clinic Hours: °Wednesdays from 6pm - 9pm, patients seen by a lottery system. °For dates, call or go to www.med.[facility].edu/[facility]/patients/Dental-[facility] °Services: °Cleanings, fillings and simple extractions. °Payment Options: °DENTAL WORK IS FREE OF CHARGE. Bring proof of income or support. °Best way to get seen: °Arrive at 5:15 pm - this is a lottery, NOT first come/first serve, so arriving earlier will not increase your chances of being seen. °  °  °[facility] °[phone] °Select option 1 for emergencies °  °Location: °[facility], [address], [place] °Clinic Hours: °No walk-ins accepted - call the day before to schedule an appointment. °Check in times are 9:30 am and 1:30 pm. °Services: °Simple extractions, temporary fillings, pulpectomy/pulp debridement, uncomplicated abscess drainage. °Payment Options: °PAYMENT IS DUE AT THE TIME OF SERVICE.  Fee is usually $100-200, additional surgical procedures (e.g. abscess drainage) may be extra. °Cash, checks, Visa/MasterCard accepted.  Can file Medicaid if patient is covered for dental - patient should call case worker to check. °No discount for [facility] patients. °Best way to get seen: °MUST call the day before and get onto the schedule. Can usually be seen the next 1-2 days. No walk-ins accepted. °  °  °[facility] °[phone] °   °Location: °[facility], [address], [place] °Clinic Hours: °M, W, Th, F 8am or 1:30pm, Tues 9a or 1:30 - first come/first served. °Services: °Simple extractions, temporary fillings, uncomplicated abscess drainage.  You do not need to be an [place] resident. °Payment Options: °PAYMENT IS DUE AT THE TIME OF SERVICE. °Dental insurance, otherwise sliding scale - bring proof of income or support. °Depending on income and treatment needed, cost is usually $50-200. °Best way to get seen: °Arrive early as it is first come/first served. °  °  °[facility] °[phone] °  °Location: °[address] °Clinic Hours: °Mon-Thu 8a-5p °Services: °Most basic dental services including extractions and fillings. °Payment Options: °PAYMENT IS DUE AT THE TIME OF SERVICE. °Sliding scale, up to 50% off - bring proof if income or support. °Medicaid with dental option accepted. °Best way to get seen: °Call to schedule an appointment, can usually be seen within 2 weeks OR they will try to see walk-ins - show up at 8a or 2p (you may have to wait). °  °  °[facility] °919-245-2435 °[place] RESIDENTS ONLY °  °Location: °[facility], [address], [place], [place] [address] °Clinic Hours: By appointment only. °Monday - Thursday 8am-5pm, Friday 8am-12pm °Services: Cleanings, fillings, extractions. °Payment Options: °PAYMENT IS DUE AT THE TIME OF SERVICE. °Cash, Visa or MasterCard. Sliding scale - $30 minimum per service. °Best way to get seen: °Come in to office, complete packet and make an appointment - need proof of income °or support monies for each household member and proof of [place] residence. °Usually takes about a month to get in. °  °  °[facility] °[phone] °  °Location: °[address]   [place] °Clinic Hours: Walk-in Urgent Care Dental Services are offered Monday-Friday  mornings only. °The numbers of emergencies accepted daily is limited to the number of °providers available. °Maximum 15 - Mondays, Wednesdays & Thursdays °Maximum 10 - Tuesdays & Fridays °Services: °You do not need to be a [place] resident to be seen for a dental emergency. °Emergencies are defined as pain, swelling, abnormal bleeding, or dental trauma. Walkins will receive x-rays if needed. °NOTE: Dental cleaning is not an emergency. °Payment Options: °PAYMENT IS DUE AT THE TIME OF SERVICE. °Minimum co-pay is $40.00 for uninsured patients. °Minimum co-pay is $3.00 for Medicaid with dental coverage. °Dental Insurance is accepted and must be presented at time of visit. °Medicare does not cover dental. °Forms of payment: Cash, credit card, checks. °Best way to get seen: °If not previously registered with the clinic, walk-in dental registration begins at 7:15 am and is on a first come/first serve basis. °If previously registered with the clinic, call to make an appointment. °  °  °[facility] °[phone] °[place] RESIDENTS ONLY °  °Location: °[address], [place], [place] °Clinic Hours: °Mon-Thu 10a-2p °Services: Extractions only! °Payment Options: °FREE (donations accepted) - bring proof of income or support °Best way to get seen: °Call and schedule an appointment OR come at 8am on the 1st Monday of every month (except for holidays) when it is first come/first served. °  °  °[facility] °[phone] °  °Location: °[address], [place] °Clinic Hours: °Friday mornings °Services, Payment Options, Best way to get seen: °Call for info °

## 2021-08-02 NOTE — ED Notes (Signed)
See triage note  presents with dental pain  states his pain is mainly to front teeth and left back teeth   ?

## 2023-01-27 DIAGNOSIS — R7309 Other abnormal glucose: Secondary | ICD-10-CM | POA: Diagnosis not present

## 2023-01-27 DIAGNOSIS — R0602 Shortness of breath: Secondary | ICD-10-CM | POA: Diagnosis not present

## 2023-01-27 DIAGNOSIS — R109 Unspecified abdominal pain: Secondary | ICD-10-CM | POA: Diagnosis not present

## 2023-01-27 DIAGNOSIS — Z1389 Encounter for screening for other disorder: Secondary | ICD-10-CM | POA: Diagnosis not present

## 2023-01-27 DIAGNOSIS — R0989 Other specified symptoms and signs involving the circulatory and respiratory systems: Secondary | ICD-10-CM | POA: Diagnosis not present

## 2023-01-27 DIAGNOSIS — E785 Hyperlipidemia, unspecified: Secondary | ICD-10-CM | POA: Diagnosis not present

## 2023-01-27 DIAGNOSIS — Z1331 Encounter for screening for depression: Secondary | ICD-10-CM | POA: Diagnosis not present

## 2023-01-27 DIAGNOSIS — H1132 Conjunctival hemorrhage, left eye: Secondary | ICD-10-CM | POA: Diagnosis not present

## 2023-03-05 ENCOUNTER — Encounter: Payer: Self-pay | Admitting: Family Medicine

## 2023-03-06 ENCOUNTER — Other Ambulatory Visit: Payer: Self-pay | Admitting: Family Medicine

## 2023-03-06 DIAGNOSIS — R109 Unspecified abdominal pain: Secondary | ICD-10-CM

## 2023-03-14 ENCOUNTER — Ambulatory Visit: Payer: Medicaid Other | Attending: Family Medicine

## 2023-03-25 ENCOUNTER — Ambulatory Visit
Admission: RE | Admit: 2023-03-25 | Discharge: 2023-03-25 | Disposition: A | Discharge status: Home / Self Care | Payer: Medicaid Other | Source: Ambulatory Visit | Attending: Family Medicine | Admitting: Family Medicine

## 2023-03-25 DIAGNOSIS — R109 Unspecified abdominal pain: Secondary | ICD-10-CM | POA: Insufficient documentation

## 2023-03-25 DIAGNOSIS — I700 Atherosclerosis of aorta: Secondary | ICD-10-CM | POA: Diagnosis not present

## 2023-03-25 DIAGNOSIS — N289 Disorder of kidney and ureter, unspecified: Secondary | ICD-10-CM | POA: Diagnosis not present

## 2023-03-25 DIAGNOSIS — N281 Cyst of kidney, acquired: Secondary | ICD-10-CM | POA: Diagnosis not present

## 2023-05-20 DIAGNOSIS — R0989 Other specified symptoms and signs involving the circulatory and respiratory systems: Secondary | ICD-10-CM | POA: Diagnosis not present

## 2023-05-20 DIAGNOSIS — Z1211 Encounter for screening for malignant neoplasm of colon: Secondary | ICD-10-CM | POA: Diagnosis not present

## 2023-05-20 DIAGNOSIS — K219 Gastro-esophageal reflux disease without esophagitis: Secondary | ICD-10-CM | POA: Diagnosis not present

## 2023-07-14 DIAGNOSIS — D120 Benign neoplasm of cecum: Secondary | ICD-10-CM | POA: Diagnosis not present

## 2023-07-14 DIAGNOSIS — D122 Benign neoplasm of ascending colon: Secondary | ICD-10-CM | POA: Diagnosis not present

## 2023-07-14 DIAGNOSIS — Z1211 Encounter for screening for malignant neoplasm of colon: Secondary | ICD-10-CM | POA: Diagnosis not present

## 2023-07-14 DIAGNOSIS — R1310 Dysphagia, unspecified: Secondary | ICD-10-CM | POA: Diagnosis not present

## 2023-07-15 DIAGNOSIS — Z1211 Encounter for screening for malignant neoplasm of colon: Secondary | ICD-10-CM | POA: Diagnosis not present

## 2023-07-15 DIAGNOSIS — K635 Polyp of colon: Secondary | ICD-10-CM | POA: Diagnosis not present

## 2023-07-15 DIAGNOSIS — D122 Benign neoplasm of ascending colon: Secondary | ICD-10-CM | POA: Diagnosis not present

## 2023-09-17 DIAGNOSIS — B351 Tinea unguium: Secondary | ICD-10-CM | POA: Diagnosis not present

## 2023-09-17 DIAGNOSIS — Z1389 Encounter for screening for other disorder: Secondary | ICD-10-CM | POA: Diagnosis not present

## 2023-09-17 DIAGNOSIS — K219 Gastro-esophageal reflux disease without esophagitis: Secondary | ICD-10-CM | POA: Diagnosis not present

## 2023-09-17 DIAGNOSIS — R030 Elevated blood-pressure reading, without diagnosis of hypertension: Secondary | ICD-10-CM | POA: Diagnosis not present

## 2023-09-17 DIAGNOSIS — M545 Low back pain, unspecified: Secondary | ICD-10-CM | POA: Diagnosis not present

## 2023-09-17 DIAGNOSIS — L821 Other seborrheic keratosis: Secondary | ICD-10-CM | POA: Diagnosis not present

## 2023-09-17 DIAGNOSIS — E785 Hyperlipidemia, unspecified: Secondary | ICD-10-CM | POA: Diagnosis not present

## 2023-10-13 ENCOUNTER — Other Ambulatory Visit: Payer: Self-pay

## 2023-10-13 ENCOUNTER — Emergency Department

## 2023-10-13 ENCOUNTER — Emergency Department
Admission: EM | Admit: 2023-10-13 | Discharge: 2023-10-13 | Disposition: A | Discharge status: Home / Self Care | Attending: Emergency Medicine | Admitting: Emergency Medicine

## 2023-10-13 DIAGNOSIS — G8929 Other chronic pain: Secondary | ICD-10-CM | POA: Insufficient documentation

## 2023-10-13 DIAGNOSIS — M546 Pain in thoracic spine: Secondary | ICD-10-CM | POA: Insufficient documentation

## 2023-10-13 DIAGNOSIS — R0781 Pleurodynia: Secondary | ICD-10-CM | POA: Diagnosis not present

## 2023-10-13 DIAGNOSIS — F17210 Nicotine dependence, cigarettes, uncomplicated: Secondary | ICD-10-CM | POA: Insufficient documentation

## 2023-10-13 DIAGNOSIS — M545 Low back pain, unspecified: Secondary | ICD-10-CM | POA: Diagnosis not present

## 2023-10-13 HISTORY — DX: Hyperlipidemia, unspecified: E78.5

## 2023-10-13 LAB — CBC WITH DIFFERENTIAL/PLATELET
Abs Immature Granulocytes: 0.07 10*3/uL (ref 0.00–0.07)
Basophils Absolute: 0.1 10*3/uL (ref 0.0–0.1)
Basophils Relative: 1 %
Eosinophils Absolute: 0.1 10*3/uL (ref 0.0–0.5)
Eosinophils Relative: 1 %
HCT: 47.1 % (ref 39.0–52.0)
Hemoglobin: 15.7 g/dL (ref 13.0–17.0)
Immature Granulocytes: 1 %
Lymphocytes Relative: 25 %
Lymphs Abs: 2.3 10*3/uL (ref 0.7–4.0)
MCH: 30.7 pg (ref 26.0–34.0)
MCHC: 33.3 g/dL (ref 30.0–36.0)
MCV: 92 fL (ref 80.0–100.0)
Monocytes Absolute: 0.8 10*3/uL (ref 0.1–1.0)
Monocytes Relative: 9 %
Neutro Abs: 6 10*3/uL (ref 1.7–7.7)
Neutrophils Relative %: 63 %
Platelets: 389 10*3/uL (ref 150–400)
RBC: 5.12 MIL/uL (ref 4.22–5.81)
RDW: 12.9 % (ref 11.5–15.5)
WBC: 9.4 10*3/uL (ref 4.0–10.5)
nRBC: 0 % (ref 0.0–0.2)

## 2023-10-13 LAB — COMPREHENSIVE METABOLIC PANEL WITH GFR
ALT: 23 U/L (ref 0–44)
AST: 18 U/L (ref 15–41)
Albumin: 4.1 g/dL (ref 3.5–5.0)
Alkaline Phosphatase: 112 U/L (ref 38–126)
Anion gap: 9 (ref 5–15)
BUN: 8 mg/dL (ref 8–23)
CO2: 26 mmol/L (ref 22–32)
Calcium: 8.8 mg/dL — ABNORMAL LOW (ref 8.9–10.3)
Chloride: 102 mmol/L (ref 98–111)
Creatinine, Ser: 0.93 mg/dL (ref 0.61–1.24)
GFR, Estimated: >60 mL/min (ref >60)
Glucose, Bld: 93 mg/dL (ref 70–99)
Potassium: 4.1 mmol/L (ref 3.5–5.1)
Sodium: 137 mmol/L (ref 135–145)
Total Bilirubin: 0.8 mg/dL (ref 0.0–1.2)
Total Protein: 8 g/dL (ref 6.5–8.1)

## 2023-10-13 LAB — TROPONIN I (HIGH SENSITIVITY)
Troponin I (High Sensitivity): 4 ng/L (ref <18)
Troponin I (High Sensitivity): 5 ng/L (ref <18)

## 2023-10-13 LAB — D-DIMER, QUANTITATIVE: D-Dimer, Quant: 0.32 ug{FEU}/mL (ref 0.00–0.50)

## 2023-10-13 MED ORDER — KETOROLAC TROMETHAMINE 15 MG/ML IJ SOLN
30.0000 mg | Freq: Once | INTRAMUSCULAR | Status: AC
  Administered 2023-10-13: 30 mg via INTRAMUSCULAR
  Filled 2023-10-13: qty 2

## 2023-10-13 NOTE — ED Provider Notes (Signed)
 Alfred Jackson Provider Note   CSN: [id number] Arrival date & time: 10/13/23  0740     History  Chief Complaint  Patient presents with   Back Pain    Alfred Jackson is a 63 y.o. male.  Patient here for back pain.  He notes mid upper back pain for over 1 year.  This does seem to come and go.  Has taken Tylenol  intermittently without significant improvement.  Notes that the pain seems to be worse when moving.  Time of day does not affect it.  No cough congestion fevers chills abdominal pain or dysuria.  Does smoke cigarettes.   Back Pain Associated symptoms: no abdominal pain, no chest pain, no dysuria, no fever, no numbness and no weakness        Home Medications Prior to Admission medications   Medication Sig Start Date End Date Taking? Authorizing Provider  acetaminophen  (TYLENOL ) 500 MG tablet Take 1,000 mg by mouth every 6 (six) hours as needed for moderate pain or headache.    [provider]  Aspirin-Acetaminophen -Caffeine (GOODY HEADACHE PO) Take 1 Package by mouth daily as needed (for headache).    [provider]      Allergies    Patient has no known allergies.    Review of Systems   Review of Systems  Constitutional:  Negative for chills and fever.  HENT:  Negative for congestion.   Respiratory:  Negative for cough and shortness of breath.   Cardiovascular:  Negative for chest pain.  Gastrointestinal:  Negative for abdominal pain, diarrhea, nausea and vomiting.  Genitourinary:  Negative for dysuria.  Musculoskeletal:  Positive for back pain.  Neurological:  Negative for weakness and numbness.    Physical Exam Updated Vital Signs BP (!) 171/85   Pulse 77   Temp 97.6 F (36.4 C)   Resp 18   Ht 5\' 11"  (1.803 m)   Wt 90.7 kg   SpO2 100%   BMI 27.89 kg/m  Physical Exam Vitals reviewed.  Constitutional:      Appearance: Normal appearance.  HENT:     Head: Normocephalic and atraumatic.      Nose: Nose normal.  Cardiovascular:     Pulses: Normal pulses.  Pulmonary:     Effort: Pulmonary effort is normal.  Abdominal:     Tenderness: There is no abdominal tenderness. There is no right CVA tenderness.  Musculoskeletal:        General: No swelling or tenderness.     Cervical back: Normal range of motion.     Comments: No tenderness of cervical thoracic or lumbar spine.  Neurological:     Mental Status: He is alert and oriented to person, place, and time. Mental status is at baseline.     Sensory: No sensory deficit.     Motor: No weakness.     Gait: Gait normal.     Comments: Lower extremity strength and sensation equal and bilateral.  2+ equal bilateral patellar reflexes.  Psychiatric:        Mood and Affect: Mood normal.        Behavior: Behavior normal.     ED Results / Procedures / Treatments   Labs (all labs ordered are listed, but only abnormal results are displayed) Labs Reviewed  COMPREHENSIVE METABOLIC PANEL WITH GFR - Abnormal; Notable for the following components:      Result Value   Calcium 8.8 (*)    All other components within normal limits  CBC WITH DIFFERENTIAL/PLATELET  D-DIMER, QUANTITATIVE  TROPONIN I (HIGH SENSITIVITY)  TROPONIN I (HIGH SENSITIVITY)    EKG EKG Interpretation Date/Time:  Monday October 13 2023 08:49:41 EDT Ventricular Rate:  51 PR Interval:  152 QRS Duration:  96 QT Interval:  416 QTC Calculation: 383 R Axis:   59  Text Interpretation: Sinus bradycardia Possible Inferior infarct , age undetermined Abnormal ECG When compared with ECG of 21-May-2013 16:59, PREVIOUS ECG IS PRESENT Confirmed by UNCONFIRMED, DOCTOR ([id number]), editor Ara Bays [phone]) on 10/13/2023 9:55:25 AM  Radiology DG Chest 2 View Result Date: 10/13/2023 CLINICAL DATA:  Rib pain EXAM: CHEST - 2 VIEW COMPARISON:  May 21, 2013 FINDINGS: The heart size and mediastinal contours are within normal limits. Both lungs are clear. The visualized skeletal structures are  unremarkable. IMPRESSION: No active cardiopulmonary disease. Electronically Signed   By: Fredrich Jefferson M.D.   On: 10/13/2023 08:25    Procedures Procedures    Medications Ordered in ED Medications  ketorolac  (TORADOL ) 15 MG/ML injection 30 mg (30 mg Intramuscular Given 10/13/23 0830)    ED Course/ Medical Decision Making/ A&P Clinical Course as of 10/13/23 1220  Mon Oct 13, 2023  0920 Chest x-ray and EKG overall unremarkable.  On reevaluation patient does still have some pain mostly right posterior rib.  Discussed with attending Dr. Melven Stable who recommends further evaluation with lab work.  Will check CBC CMP D-dimer and troponin although have a very low suspicion of ACS or PE [SR]    Clinical Course User Index [SR] Hollie Luria, PA-C                                 Medical Decision Making Patient here for chronic back pain.  It is mostly mid back patient reporting it feels like it is his lungs at times although it is more positional.  He has no hypoxia no shortness of breath no chest pain afebrile nontachycardic doubt a pneumonia PE or ACS.  Especially given the timeline of over 1 year.  He has no lower back pain or tenderness.  No red flag symptoms such as numbness weakness bowel or bladder dysfunction.  Physical exam overall unremarkable.  No CVA tenderness dysuria abdominal pain and symptoms for 1 year doubt a kidney stone or urinary tract infection.  Will check EKG out of abundance of caution and a chest x-ray.  Medicate with Toradol  and reevaluate.  Labs were obtained.  Has no leukocytosis no significant anemia.  D-dimer is negative ruled out for pulmonary embolus.  No metabolic derangements.  Troponins are negative with unremarkable EKG.  Doubt ACS.  Do suspect pain related to chronic thoracic back pain will have him follow-up with primary care NSAIDs and Tylenol  in the meantime.  Given return precautions.  Amount and/or Complexity of Data Reviewed Labs: ordered. Radiology:  ordered.  Risk Prescription drug management.           Final Clinical Impression(s) / ED Diagnoses Final diagnoses:  Chronic back pain, unspecified back location, unspecified back pain laterality  Rib pain    Rx / DC Orders ED Discharge Orders          Ordered    Ambulatory Referral to Primary Care (Establish Care)        10/13/23 0915              Hollie Luria, PA-C 10/13/23 1220    Viviano Ground, MD 10/13/23 [phone]

## 2023-10-13 NOTE — ED Triage Notes (Signed)
 Pt comes with c/o back pain. Pt states mid back pain. Pt states bilateral. Pt states this started months ago. Pt states it has jsut gotten worse.

## 2023-10-13 NOTE — ED Notes (Signed)
 See triage notes. Patient c/o his lungs hurting. Patient points to a region in the mid-back bilaterally in reference to the pain. Patient stated he smokes about a pack a day.

## 2023-10-13 NOTE — Discharge Instructions (Signed)
 Please continue Tylenol  ibuprofen  as needed for pain.  Follow-up with primary care for recheck.  Return here for new or worse symptoms.

## 2023-10-13 NOTE — ED Provider Notes (Signed)
 Shared visit  Patient presents to the emergency department with ongoing back pain and chest pain.  States that has been ongoing for the past year but is worsened today which brought him in.  Also endorses smoking approximately 1 pack a day.  History of hyperlipidemia.  Denies any history of hypertension.  No family history coronary artery disease at a young age.  Pulling himself up worsens the pain.  Denies any exertional symptoms.  No history of DVT or PE.  EKG without findings of acute ischemia or dysrhythmia.  No significant ST elevation or depression.  Low risk Wells criteria and will obtain a screening D-dimer to further risk ratified for pulmonary embolism.  Clinical picture is not consistent with dissection.  Will do 1 serial troponin given ongoing nature of his symptoms.  Discussed at length smoking cessation and follow-up with primary care physician and cardiology.   Viviano Ground, MD 10/13/23 [phone]
